# Patient Record
Sex: Female | Born: 1955
Health system: Southern US, Community
[De-identification: ages and names within clinical notes are randomized; demographics above are authoritative.]

---

## 1978-10-04 HISTORY — PX: ABDOMINAL HYSTERECTOMY: SHX81

## 2005-09-08 ENCOUNTER — Ambulatory Visit: Payer: Self-pay

## 2007-10-26 ENCOUNTER — Ambulatory Visit: Payer: Self-pay | Admitting: Family Medicine

## 2009-01-09 ENCOUNTER — Ambulatory Visit: Payer: Self-pay | Admitting: Family Medicine

## 2010-03-17 ENCOUNTER — Ambulatory Visit: Payer: Self-pay | Admitting: Family Medicine

## 2011-07-26 ENCOUNTER — Ambulatory Visit: Payer: Self-pay | Admitting: Family Medicine

## 2011-08-16 LAB — HM COLONOSCOPY

## 2012-07-27 ENCOUNTER — Ambulatory Visit: Payer: Self-pay | Admitting: Family Medicine

## 2013-07-30 ENCOUNTER — Ambulatory Visit: Payer: Self-pay | Admitting: Family Medicine

## 2014-07-23 LAB — BASIC METABOLIC PANEL
BUN: 18 mg/dL (ref 4–21)
Creatinine: 0.8 mg/dL (ref 0.5–1.1)
Glucose: 86 mg/dL
Potassium: 4.5 mmol/L (ref 3.4–5.3)
Sodium: 140 mmol/L (ref 137–147)

## 2014-07-23 LAB — LIPID PANEL
Cholesterol: 200 mg/dL (ref 0–200)
HDL: 49 mg/dL (ref 35–70)
LDL Cholesterol: 131 mg/dL
Triglycerides: 98 mg/dL (ref 40–160)

## 2014-08-22 ENCOUNTER — Ambulatory Visit: Payer: Self-pay | Admitting: Family Medicine

## 2015-07-30 DIAGNOSIS — Z803 Family history of malignant neoplasm of breast: Secondary | ICD-10-CM | POA: Insufficient documentation

## 2015-07-30 DIAGNOSIS — Z8041 Family history of malignant neoplasm of ovary: Secondary | ICD-10-CM | POA: Insufficient documentation

## 2015-07-30 DIAGNOSIS — Z8742 Personal history of other diseases of the female genital tract: Secondary | ICD-10-CM | POA: Insufficient documentation

## 2015-07-31 ENCOUNTER — Encounter: Payer: Self-pay | Admitting: Family Medicine

## 2015-07-31 ENCOUNTER — Ambulatory Visit (INDEPENDENT_AMBULATORY_CARE_PROVIDER_SITE_OTHER): Payer: BLUE CROSS/BLUE SHIELD | Admitting: Family Medicine

## 2015-07-31 VITALS — BP 108/66 | HR 72 | Temp 97.9°F | Resp 14 | Ht 62.5 in | Wt 115.0 lb

## 2015-07-31 DIAGNOSIS — Z8041 Family history of malignant neoplasm of ovary: Secondary | ICD-10-CM

## 2015-07-31 DIAGNOSIS — Z1231 Encounter for screening mammogram for malignant neoplasm of breast: Secondary | ICD-10-CM

## 2015-07-31 DIAGNOSIS — Z23 Encounter for immunization: Secondary | ICD-10-CM

## 2015-07-31 DIAGNOSIS — Z0000 Encounter for general adult medical examination without abnormal findings: Secondary | ICD-10-CM

## 2015-07-31 NOTE — Progress Notes (Signed)
Patient ID: Adrienne Herring, female   DOB: 11/05/1955, 59 y.o.   MRN: [id number]     Visit Date: 07/31/2015  Today's Provider: Mila Merryonald Keaston Pile, MD   Chief Complaint  Patient presents with  . Annual Exam   Subjective:    Annual physical exam Adrienne Herring is a 59 y.o. female who presents today for health maintenance and complete physical. She feels well. She reports exercising-walking. She reports she is sleeping well.  LAST: Colon[place] 08/2011 per [place] in [place].  Mammogram 08/22/14 normal  Pap smear 07/26/14 normal-had hysterectomy before and has ovaries left.  Eye exam-2015 per patient.   Needs flu shot and she is thinking about Zostavax.    Review of Systems  Constitutional: Negative.   HENT: Negative.   Eyes: Negative.   Respiratory: Negative.   Cardiovascular: Negative.   Gastrointestinal: Negative.   Endocrine: Negative.   Genitourinary: Negative.   Musculoskeletal: Positive for arthralgias.  Skin: Negative.   Allergic/Immunologic: Negative.   Neurological: Negative.   Hematological: Negative.   Psychiatric/Behavioral: Negative.     Social History She  reports that she has never smoked. She has never used smokeless tobacco. She reports that she does not drink alcohol or use illicit drugs. Social History   Social History  . Marital Status: Married    Spouse Name: N/A  . Number of Children: N/A  . Years of Education: N/A   Social History Main Topics  . Smoking status: Never Smoker   . Smokeless tobacco: Never Used  . Alcohol Use: No  . Drug Use: No  . Sexual Activity: Not Asked   Other Topics Concern  . None   Social History Narrative    Patient Active Problem List   Diagnosis Date Noted  . H/O female genital system disorder 07/30/2015  . Family history of breast cancer 07/30/2015  . Family history of malignant neoplasm of ovary 07/30/2015    Past Surgical History  Procedure Laterality Date  . Abdominal hysterectomy  1980    due to  endometriosis, still has ovaries    Family History  Family Status  Relation Status Death Age  . Mother Deceased   . Sister Deceased   . Maternal Aunt Alive   . Maternal Grandfather Deceased   . Father Alive   . Daughter Alive   . Daughter Alive    Her family history includes Alcohol abuse in her mother; Breast cancer in her sister; Diabetes in her maternal grandfather; Ovarian cancer in her maternal aunt.    No Known Allergies  Previous Medications   ASCORBIC ACID (VITAMIN C) 100 MG TABLET       CALCIUM CARBONATE (CALCIUM-CARB 600 PO)       MULTIPLE VITAMINS-MINERALS (MULTIVITAMIN ADULT PO)       VITAMIN D, CHOLECALCIFEROL, 400 UNITS TABS    VITAMIN D, 400UNIT (Oral Tablet)  1 Every Day for 0 days  Quantity: 0.00;  Refills: 0   Ordered :21-Jul-2011  Awilda Billhambers, [place] ;  Started 30-Dec-2008 Active    Patient Care Team: Malva Limesonald E Melburn Treiber, MD as PCP - General (Family Medicine)     Objective:   Vitals: BP 108/66 mmHg  Pulse 72  Temp(Src) 97.9 F (36.6 C)  Resp 14  Ht 5' 2.5" (1.588 m)  Wt 115 lb (52.164 kg)  BMI 20.69 kg/m2   Physical Exam  Constitutional: She is oriented to person, place, and time. She appears well-developed and well-nourished.  HENT:  Head: Normocephalic and atraumatic.  Right Ear: External  ear normal.  Left Ear: External ear normal.  Nose: Nose normal.  Mouth/Throat: Oropharynx is clear and moist. No oropharyngeal exudate.  Eyes: Conjunctivae are normal. Pupils are equal, round, and reactive to light.  Cardiovascular: Normal rate, regular rhythm, normal heart sounds and intact distal pulses.   No murmur heard. Pulmonary/Chest: Effort normal and breath sounds normal. No respiratory distress. She has no wheezes. She has no rales. Right breast exhibits no inverted nipple, no mass and no nipple discharge. Left breast exhibits no inverted nipple, no mass and no nipple discharge. Breasts are symmetrical.  Abdominal: Soft. Bowel sounds are normal. She  exhibits no distension and no mass. There is no tenderness. There is no rebound.  Neurological: She is alert and oriented to person, place, and time.  Skin: No rash noted. No erythema.  Psychiatric: She has a normal mood and affect. Her behavior is normal. Judgment and thought content normal.     Depression Screen PHQ 2/9 Scores 07/31/2015  PHQ - 2 Score 0    Lab Results  Component Value Date   CHOL 200 07/23/2014   HDL 49 07/23/2014   LDLCALC 131 07/23/2014   TRIG 98 07/23/2014     Chemistry      Component Value Date/Time   NA 140 07/23/2014   K 4.5 07/23/2014   BUN 18 07/23/2014   CREATININE 0.8 07/23/2014   GLU 86 07/23/2014       Assessment & Plan:  1. Annual physical exam Discussed with patient today getting Zoster vaccine, patient will wait at this time as she does not want to expose anyone to live vaccine.  - Lipid Panel With LDL/HDL Ratio - Basic metabolic panel  2. Visit for screening mammogram Patient advised to call [facility] and get this scheduled. - MM Digital Screening; Future  3. Need for influenza vaccination This administered today. - Flu Vaccine QUAD 36+ mos IM  4. Family history of ovarian cancer - CA-125

## 2015-08-01 LAB — BASIC METABOLIC PANEL
BUN/Creatinine Ratio: 17 (ref 11–26)
BUN: 11 mg/dL (ref 8–27)
CO2: 26 mmol/L (ref 18–29)
Calcium: 9.6 mg/dL (ref 8.7–10.3)
Chloride: 100 mmol/L (ref 97–106)
Creatinine, Ser: 0.64 mg/dL (ref 0.57–1.00)
GFR calc Af Amer: 112 mL/min/{1.73_m2} (ref >59)
GFR calc non Af Amer: 97 mL/min/{1.73_m2} (ref >59)
Glucose: 87 mg/dL (ref 65–99)
Potassium: 4.7 mmol/L (ref 3.5–5.2)
Sodium: 139 mmol/L (ref 136–144)

## 2015-08-01 LAB — LIPID PANEL WITH LDL/HDL RATIO
Cholesterol, Total: 186 mg/dL (ref 100–199)
HDL: 50 mg/dL (ref >39)
LDL Calculated: 110 mg/dL — ABNORMAL HIGH (ref 0–99)
LDl/HDL Ratio: 2.2 ratio units (ref 0.0–3.2)
Triglycerides: 131 mg/dL (ref 0–149)
VLDL Cholesterol Cal: 26 mg/dL (ref 5–40)

## 2015-08-01 LAB — CA 125: CA 125: 19.8 U/mL (ref 0.0–38.1)

## 2015-08-01 NOTE — Progress Notes (Signed)
Patient has been advised. KW 

## 2015-09-03 ENCOUNTER — Ambulatory Visit
Admission: RE | Admit: 2015-09-03 | Discharge: 2015-09-03 | Disposition: A | Discharge status: Home / Self Care | Payer: BLUE CROSS/BLUE SHIELD | Source: Ambulatory Visit | Attending: Family Medicine | Admitting: Family Medicine

## 2015-09-03 DIAGNOSIS — Z1231 Encounter for screening mammogram for malignant neoplasm of breast: Secondary | ICD-10-CM | POA: Diagnosis not present

## 2016-07-27 ENCOUNTER — Other Ambulatory Visit: Payer: Self-pay | Admitting: Family Medicine

## 2016-07-28 ENCOUNTER — Telehealth: Payer: Self-pay | Admitting: Family Medicine

## 2016-07-28 DIAGNOSIS — Z0000 Encounter for general adult medical examination without abnormal findings: Secondary | ICD-10-CM

## 2016-07-28 NOTE — Telephone Encounter (Signed)
Please review. KW 

## 2016-07-28 NOTE — Telephone Encounter (Signed)
Pt is having upcoming CPE and wants to get her labs done beforehand.  She siad she wants to go to the lab early that morning.  [name]

## 2016-08-02 ENCOUNTER — Encounter: Payer: Self-pay | Admitting: Family Medicine

## 2016-08-02 ENCOUNTER — Ambulatory Visit (INDEPENDENT_AMBULATORY_CARE_PROVIDER_SITE_OTHER): Payer: BLUE CROSS/BLUE SHIELD | Admitting: Family Medicine

## 2016-08-02 VITALS — BP 110/68 | Temp 98.0°F | Resp 16 | Ht 63.0 in | Wt 115.0 lb

## 2016-08-02 DIAGNOSIS — Z23 Encounter for immunization: Secondary | ICD-10-CM

## 2016-08-02 DIAGNOSIS — Z0000 Encounter for general adult medical examination without abnormal findings: Secondary | ICD-10-CM

## 2016-08-02 NOTE — Progress Notes (Signed)
Patient: Adrienne Herring, Female    DOB: 09/09/1956, 60 y.o.   MRN: [id number] Visit Date: 08/02/2016  Today's Provider: Mila Merryonald Fisher, MD   Chief Complaint  Patient presents with  . Annual Exam   Subjective:    Annual physical exam Adrienne PollenDonna S Herring is a 60 y.o. female who presents today for health maintenance and complete physical. She feels well. She reports exercising daily. She reports she is sleeping well.     Review of Systems  Constitutional: Negative.   HENT: Negative.   Eyes: Negative.   Respiratory: Negative.   Cardiovascular: Negative.   Gastrointestinal: Negative.   Endocrine: Negative.   Genitourinary: Negative.   Musculoskeletal: Negative.   Skin: Negative.   Allergic/Immunologic: Negative.   Neurological: Negative.   Hematological: Negative.   Psychiatric/Behavioral: Negative.     Social History      She  reports that she has never smoked. She has never used smokeless tobacco. She reports that she does not drink alcohol or use drugs.       Social History   Social History  . Marital status: Married    Spouse name: N/A  . Number of children: N/A  . Years of education: N/A   Social History Main Topics  . Smoking status: Never Smoker  . Smokeless tobacco: Never Used  . Alcohol use No  . Drug use: No  . Sexual activity: Not Asked   Other Topics Concern  . None   Social History Narrative  . None    No past medical history on file.   Patient Active Problem List   Diagnosis Date Noted  . H/O female genital system disorder 07/30/2015  . Family history of breast cancer 07/30/2015  . Family history of malignant neoplasm of ovary 07/30/2015    Past Surgical History:  Procedure Laterality Date  . ABDOMINAL HYSTERECTOMY  1980   due to endometriosis, still has ovaries    Family History        Family Status  Relation Status  . Mother Deceased  . Sister Deceased  . Maternal Aunt Alive  . Maternal Grandfather Deceased  . Father Alive   . Daughter Alive  . Daughter Alive        Her family history includes Alcohol abuse in her mother; Breast cancer (age of onset: 6848) in her sister; Diabetes in her maternal grandfather; Ovarian cancer in her maternal aunt.    No Known Allergies  Current Meds  Medication Sig  . Ascorbic Acid (VITAMIN C) 100 MG tablet   . Calcium Carbonate (CALCIUM-CARB 600 PO)   . Multiple Vitamins-Minerals (MULTIVITAMIN ADULT PO)   . Vitamin D, Cholecalciferol, 400 UNITS TABS VITAMIN D, 400UNIT (Oral Tablet)  1 Every Day for 0 days  Quantity: 0.00;  Refills: 0   Ordered :21-Jul-2011  Awilda Billhambers, [place] ;  Started 30-Dec-2008 Active    Patient Care Team: Malva Limesonald E Fisher, MD as PCP - General (Family Medicine)     Objective:   Vitals: BP 110/68 (BP Location: Left Arm, Patient Position: Sitting, Cuff Size: Normal)   Temp 98 F (36.7 C)   Resp 16   Ht 5\' 3"  (1.6 m)   Wt 115 lb (52.2 kg)   BMI 20.37 kg/m    Physical Exam  Constitutional: She is oriented to person, place, and time. She appears well-developed and well-nourished.  HENT:  Head: Normocephalic and atraumatic.  Right Ear: External ear normal.  Left Ear: External ear normal.  Nose: Nose normal.  Mouth/Throat: Oropharynx is clear and moist.  Eyes: Conjunctivae and EOM are normal. Pupils are equal, round, and reactive to light.  Neck: Normal range of motion. Neck supple.  Cardiovascular: Normal rate, regular rhythm, normal heart sounds and intact distal pulses.   Pulmonary/Chest: Effort normal and breath sounds normal. Right breast exhibits no mass, no nipple discharge and no tenderness. Left breast exhibits no mass, no nipple discharge and no tenderness.  Abdominal: Soft. Bowel sounds are normal. There is no tenderness.  Musculoskeletal: Normal range of motion.  Neurological: She is alert and oriented to person, place, and time. She has normal reflexes.  Skin: Skin is warm and dry.  Psychiatric: She has a normal mood and affect.  Her behavior is normal. Judgment and thought content normal.     Depression Screen PHQ 2/9 Scores 07/31/2015  PHQ - 2 Score 0      Assessment & Plan:     Routine Health Maintenance and Physical Exam  Exercise Activities and Dietary recommendations Goals    None      Immunization History  Administered Date(s) Administered  . Influenza,inj,Quad PF,36+ Mos 07/31/2015  . Tdap 10/24/2007    Health Maintenance  Topic Date Due  . HIV Screening  10/09/1969  . COLONOSCOPY  10/09/2004  . ZOSTAVAX  10/09/2014  . INFLUENZA VACCINE  05/04/2016  . MAMMOGRAM  09/02/2017  . TETANUS/TDAP  10/23/2017  . Hepatitis C Screening  Completed      Discussed health benefits of physical activity, and encouraged her to engage in regular exercise appropriate for her age and condition.    --------------------------------------------------------------------  1. Annual physical exam Patient declined shingles vaccine today  2. Need for influenza vaccination  - Flu Vaccine QUAD 36+ mos PF IM (Fluarix & Fluzone Quad PF)  The entirety of the information documented in the History of Present Illness, Review of Systems and Physical Exam were personally obtained by me. Portions of this information were initially documented by Anson Oregonachelle Presley, CMA and reviewed by me for thoroughness and accuracy.     Mila Merryonald Fisher, MD  [facility]

## 2016-08-03 ENCOUNTER — Other Ambulatory Visit: Payer: Self-pay | Admitting: Family Medicine

## 2016-08-03 DIAGNOSIS — Z1231 Encounter for screening mammogram for malignant neoplasm of breast: Secondary | ICD-10-CM

## 2016-08-03 LAB — COMPREHENSIVE METABOLIC PANEL
ALT: 11 IU/L (ref 0–32)
AST: 18 IU/L (ref 0–40)
Albumin/Globulin Ratio: 1.8 (ref 1.2–2.2)
Albumin: 4.8 g/dL (ref 3.6–4.8)
Alkaline Phosphatase: 54 IU/L (ref 39–117)
BUN/Creatinine Ratio: 14 (ref 12–28)
BUN: 10 mg/dL (ref 8–27)
Bilirubin Total: 0.6 mg/dL (ref 0.0–1.2)
CO2: 25 mmol/L (ref 18–29)
Calcium: 9.4 mg/dL (ref 8.7–10.3)
Chloride: 102 mmol/L (ref 96–106)
Creatinine, Ser: 0.7 mg/dL (ref 0.57–1.00)
GFR calc Af Amer: 108 mL/min/{1.73_m2} (ref >59)
GFR calc non Af Amer: 94 mL/min/{1.73_m2} (ref >59)
Globulin, Total: 2.7 g/dL (ref 1.5–4.5)
Glucose: 96 mg/dL (ref 65–99)
Potassium: 3.9 mmol/L (ref 3.5–5.2)
Sodium: 142 mmol/L (ref 134–144)
Total Protein: 7.5 g/dL (ref 6.0–8.5)

## 2016-08-03 LAB — LIPID PANEL
Chol/HDL Ratio: 3.4 ratio units (ref 0.0–4.4)
Cholesterol, Total: 195 mg/dL (ref 100–199)
HDL: 57 mg/dL (ref >39)
LDL Calculated: 122 mg/dL — ABNORMAL HIGH (ref 0–99)
Triglycerides: 82 mg/dL (ref 0–149)
VLDL Cholesterol Cal: 16 mg/dL (ref 5–40)

## 2016-08-04 ENCOUNTER — Encounter: Payer: Self-pay | Admitting: *Deleted

## 2016-09-08 ENCOUNTER — Ambulatory Visit
Admission: RE | Admit: 2016-09-08 | Discharge: 2016-09-08 | Disposition: A | Discharge status: Home / Self Care | Payer: BLUE CROSS/BLUE SHIELD | Source: Ambulatory Visit | Attending: Family Medicine | Admitting: Family Medicine

## 2016-09-08 DIAGNOSIS — Z1231 Encounter for screening mammogram for malignant neoplasm of breast: Secondary | ICD-10-CM | POA: Diagnosis not present

## 2017-07-29 ENCOUNTER — Telehealth: Payer: Self-pay | Admitting: Family Medicine

## 2017-07-29 DIAGNOSIS — Z13228 Encounter for screening for other metabolic disorders: Secondary | ICD-10-CM

## 2017-07-29 NOTE — Telephone Encounter (Signed)
Pt wants to have her labs done before her appt on 08/03/17.  Please call when lab slip is ready.

## 2017-07-29 NOTE — Telephone Encounter (Signed)
Please advised  

## 2017-08-03 ENCOUNTER — Ambulatory Visit (INDEPENDENT_AMBULATORY_CARE_PROVIDER_SITE_OTHER): Payer: BLUE CROSS/BLUE SHIELD | Admitting: Family Medicine

## 2017-08-03 ENCOUNTER — Encounter: Payer: Self-pay | Admitting: Family Medicine

## 2017-08-03 VITALS — BP 110/72 | HR 76 | Temp 98.6°F | Resp 16 | Ht 63.0 in | Wt 116.0 lb

## 2017-08-03 DIAGNOSIS — Z0000 Encounter for general adult medical examination without abnormal findings: Secondary | ICD-10-CM

## 2017-08-03 DIAGNOSIS — Z23 Encounter for immunization: Secondary | ICD-10-CM | POA: Diagnosis not present

## 2017-08-03 LAB — BASIC METABOLIC PANEL WITH GFR
BUN: 13 mg/dL (ref 7–25)
CO2: 29 mmol/L (ref 20–32)
Calcium: 9.5 mg/dL (ref 8.6–10.4)
Chloride: 102 mmol/L (ref 98–110)
Creat: 0.66 mg/dL (ref 0.50–0.99)
GFR, Est African American: 110 mL/min/{1.73_m2} (ref >=60)
GFR, Est Non African American: 95 mL/min/{1.73_m2} (ref >=60)
Glucose, Bld: 88 mg/dL (ref 65–99)
Potassium: 4.2 mmol/L (ref 3.5–5.3)
Sodium: 139 mmol/L (ref 135–146)

## 2017-08-03 LAB — LIPID PANEL
Cholesterol: 206 mg/dL — ABNORMAL HIGH (ref <200)
HDL: 56 mg/dL (ref >50)
LDL Cholesterol (Calc): 131 mg/dL (calc) — ABNORMAL HIGH
Non-HDL Cholesterol (Calc): 150 mg/dL (calc) — ABNORMAL HIGH (ref <130)
Total CHOL/HDL Ratio: 3.7 (calc) (ref <5.0)
Triglycerides: 90 mg/dL (ref <150)

## 2017-08-03 NOTE — Patient Instructions (Signed)
Preventive Care 40-64 Years, Female Preventive care refers to lifestyle choices and visits with your health care provider that can promote health and wellness. What does preventive care include?  A yearly physical exam. This is also called an annual well check.  Dental exams once or twice a year.  Routine eye exams. Ask your health care provider how often you should have your eyes checked.  Personal lifestyle choices, including: ? Daily care of your teeth and gums. ? Regular physical activity. ? Eating a healthy diet. ? Avoiding tobacco and drug use. ? Limiting alcohol use. ? Practicing safe sex. ? Taking low-dose aspirin daily starting at age 62. ? Taking vitamin and mineral supplements as recommended by your health care provider. What happens during an annual well check? The services and screenings done by your health care provider during your annual well check will depend on your age, overall health, lifestyle risk factors, and family history of disease. Counseling Your health care provider may ask you questions about your:  Alcohol use.  Tobacco use.  Drug use.  Emotional well-being.  Home and relationship well-being.  Sexual activity.  Eating habits.  Work and work Statistician.  Method of birth control.  Menstrual cycle.  Pregnancy history.  Screening You may have the following tests or measurements:  Height, weight, and BMI.  Blood pressure.  Lipid and cholesterol levels. These may be checked every 5 years, or more frequently if you are over 62 years old.  Skin check.  Lung cancer screening. You may have this screening every year starting at age 62 if you have a 30-pack-year history of smoking and currently smoke or have quit within the past 15 years.  Fecal occult blood test (FOBT) of the stool. You may have this test every year starting at age 62.  Flexible sigmoidoscopy or colonoscopy. You may have a sigmoidoscopy every 5 years or a colonoscopy  every 10 years starting at age 30.  Hepatitis C blood test.  Hepatitis B blood test.  Sexually transmitted disease (STD) testing.  Diabetes screening. This is done by checking your blood sugar (glucose) after you have not eaten for a while (fasting). You may have this done every 1-3 years.  Mammogram. This may be done every 1-2 years. Talk to your health care provider about when you should start having regular mammograms. This may depend on whether you have a family history of breast cancer.  BRCA-related cancer screening. This may be done if you have a family history of breast, ovarian, tubal, or peritoneal cancers.  Pelvic exam and Pap test. This may be done every 3 years starting at age 62. Starting at age 62, this may be done every 5 years if you have a Pap test in combination with an HPV test.  Bone density scan. This is done to screen for osteoporosis. You may have this scan if you are at high risk for osteoporosis.  Discuss your test results, treatment options, and if necessary, the need for more tests with your health care provider. Vaccines Your health care provider may recommend certain vaccines, such as:  Influenza vaccine. This is recommended every year.  Tetanus, diphtheria, and acellular pertussis (Tdap, Td) vaccine. You may need a Td booster every 10 years.  Varicella vaccine. You may need this if you have not been vaccinated.  Zoster vaccine. You may need this after age 62.  Measles, mumps, and rubella (MMR) vaccine. You may need at least one dose of MMR if you were born in  1957 or later. You may also need a second dose.  Pneumococcal 13-valent conjugate (PCV13) vaccine. You may need this if you have certain conditions and were not previously vaccinated.  Pneumococcal polysaccharide (PPSV23) vaccine. You may need one or two doses if you smoke cigarettes or if you have certain conditions.  Meningococcal vaccine. You may need this if you have certain  conditions.  Hepatitis A vaccine. You may need this if you have certain conditions or if you travel or work in places where you may be exposed to hepatitis A.  Hepatitis B vaccine. You may need this if you have certain conditions or if you travel or work in places where you may be exposed to hepatitis B.  Haemophilus influenzae type b (Hib) vaccine. You may need this if you have certain conditions.  Talk to your health care provider about which screenings and vaccines you need and how often you need them. This information is not intended to replace advice given to you by your health care provider. Make sure you discuss any questions you have with your health care provider. Document Released: 10/17/2015 Document Revised: 06/09/2016 Document Reviewed: 07/22/2015 Elsevier Interactive Patient Education  2017 [employer].

## 2017-08-03 NOTE — Progress Notes (Signed)
Patient: Adrienne Herring, Female    DOB: May 31, 1956, 61 y.o.   MRN: [id number] Visit Date: 08/03/2017  Today's Provider: [name], MD   Chief Complaint  Patient presents with  . Annual Exam   Subjective:    Annual physical exam Adrienne Herring is a 61 y.o. female who presents today for health maintenance and complete physical. She feels well. She reports exercising not regularly. She reports she is sleeping well.  Mammogram- 09/08/2016. Normal.      Review of Systems  Constitutional: Negative.   HENT: Negative.   Eyes: Negative.   Respiratory: Negative.   Cardiovascular: Negative.   Gastrointestinal: Negative.   Endocrine: Negative.   Genitourinary: Negative.   Musculoskeletal: Negative.   Skin: Negative.   Allergic/Immunologic: Negative.   Neurological: Negative.   Hematological: Negative.   Psychiatric/Behavioral: Negative.     Social History      She  reports that she has never smoked. She has never used smokeless tobacco. She reports that she does not drink alcohol or use drugs.       Social History   Social History  . Marital status: Married    Spouse name: N/A  . Number of children: N/A  . Years of education: N/A   Social History Main Topics  . Smoking status: Never Smoker  . Smokeless tobacco: Never Used  . Alcohol use No  . Drug use: No  . Sexual activity: Not Asked   Other Topics Concern  . None   Social History Narrative  . None    No past medical history on file.   Patient Active Problem List   Diagnosis Date Noted  . H/O female genital system disorder 07/30/2015  . Family history of breast cancer 07/30/2015  . Family history of malignant neoplasm of ovary 07/30/2015    Past Surgical History:  Procedure Laterality Date  . ABDOMINAL HYSTERECTOMY  1980   due to endometriosis, still has ovaries    Family History        Family Status  Relation Status  . Mother Deceased  . Sister Deceased  . Mat [employer]  . MGF  Deceased  . Father Alive  . Daughter Alive  . Daughter Alive        Her family history includes Alcohol abuse in her mother; Breast cancer (age of onset: 38) in her sister; Diabetes in her maternal grandfather; Ovarian cancer in her maternal aunt.     No Known Allergies   Current Outpatient Prescriptions:  .  Ascorbic Acid (VITAMIN C) 100 MG tablet, , Disp: , Rfl:  .  Calcium Carbonate (CALCIUM-CARB 600 PO), , Disp: , Rfl:  .  Multiple Vitamins-Minerals (MULTIVITAMIN ADULT PO), , Disp: , Rfl:  .  Vitamin D, Cholecalciferol, 400 UNITS TABS, VITAMIN D, 400UNIT (Oral Tablet)  1 Every Day for 0 days  Quantity: 0.00;  Refills: 0   Ordered :21-Jul-2011  Awilda Bill ;  Started 30-Dec-2008 Active, Disp: , Rfl:    Patient Care Team: Malva Limes, MD as PCP - General (Family Medicine)      Objective:   Vitals: BP 110/72 (BP Location: Left Arm, Patient Position: Sitting, Cuff Size: Normal)   Pulse 76   Temp 98.6 F (37 C)   Resp 16   Ht 5\' 3"  (1.6 m)   Wt 116 lb (52.6 kg)   SpO2 95%   BMI 20.55 kg/m    Vitals:   08/03/17 1446  BP:  110/72  Pulse: 76  Resp: 16  Temp: 98.6 F (37 C)  SpO2: 95%  Weight: 116 lb (52.6 kg)  Height: 5\' 3"  (1.6 m)     Physical Exam  Constitutional: She is oriented to person, place, and time. She appears well-developed and well-nourished.  HENT:  Head: Normocephalic and atraumatic.  Right Ear: External ear normal.  Left Ear: External ear normal.  Nose: Nose normal.  Mouth/Throat: Oropharynx is clear and moist.  Eyes: Pupils are equal, round, and reactive to light. Conjunctivae and EOM are normal.  Neck: Normal range of motion. Neck supple.  Cardiovascular: Normal rate, regular rhythm and normal heart sounds.   Pulmonary/Chest: Effort normal and breath sounds normal. Right breast exhibits no inverted nipple, no mass, no nipple discharge, no skin change and no tenderness. Left breast exhibits no inverted nipple, no mass, no nipple  discharge, no skin change and no tenderness. Breasts are symmetrical.  Abdominal: Soft. Bowel sounds are normal.  Musculoskeletal: Normal range of motion.  Neurological: She is alert and oriented to person, place, and time.  Skin: Skin is warm and dry.  Psychiatric: She has a normal mood and affect. Her behavior is normal. Judgment and thought content normal.  Nursing note and vitals reviewed.    Depression Screen PHQ 2/9 Scores 08/03/2017 07/31/2015  PHQ - 2 Score 0 0  PHQ- 9 Score 0 -      Assessment & Plan:     Routine Health Maintenance and Physical Exam  Exercise Activities and Dietary recommendations Goals    None      Immunization History  Administered Date(s) Administered  . Influenza,inj,Quad PF,6+ Mos 07/31/2015, 08/02/2016  . Tdap 10/24/2007    Health Maintenance  Topic Date Due  . HIV Screening  10/09/1969  . COLONOSCOPY  10/09/2004  . INFLUENZA VACCINE  05/04/2017  . TETANUS/TDAP  10/23/2017  . MAMMOGRAM  09/08/2018  . Hepatitis C Screening  Completed     Discussed health benefits of physical activity, and encouraged her to engage in regular exercise appropriate for her age and condition.    -------------------------------------------------------------------- 1. Annual physical exam Doing well. Mammogram in December. Normal colonoscopy 2012 by Dr. Markham JordanElliot per patient report. Send for records.   2. Need for influenza vaccination  - Flu Vaccine QUAD 36+ mos IM     Mila Merryonald Fisher, MD  [facility]

## 2017-08-09 ENCOUNTER — Other Ambulatory Visit: Payer: Self-pay | Admitting: Family Medicine

## 2017-08-09 DIAGNOSIS — Z1231 Encounter for screening mammogram for malignant neoplasm of breast: Secondary | ICD-10-CM

## 2017-09-09 ENCOUNTER — Ambulatory Visit
Admission: RE | Admit: 2017-09-09 | Discharge: 2017-09-09 | Disposition: A | Discharge status: Home / Self Care | Payer: BLUE CROSS/BLUE SHIELD | Source: Ambulatory Visit | Attending: Family Medicine | Admitting: Family Medicine

## 2017-09-09 DIAGNOSIS — Z1231 Encounter for screening mammogram for malignant neoplasm of breast: Secondary | ICD-10-CM | POA: Diagnosis present

## 2018-06-14 ENCOUNTER — Telehealth: Payer: Self-pay | Admitting: Family Medicine

## 2018-06-14 NOTE — Telephone Encounter (Signed)
Please advise 

## 2018-06-14 NOTE — Telephone Encounter (Signed)
Pt is scheduled for CPE on 08/09/18.  States she would like to have her labs done prior to appt.   °

## 2018-06-14 NOTE — Telephone Encounter (Signed)
Left message (Per DPR) advising pt.   Thanks,   -Vernona Rieger

## 2018-06-14 NOTE — Telephone Encounter (Signed)
She can call a week before her appointment for lab order.

## 2018-07-26 ENCOUNTER — Telehealth: Payer: Self-pay | Admitting: Family Medicine

## 2018-07-26 DIAGNOSIS — Z13228 Encounter for screening for other metabolic disorders: Secondary | ICD-10-CM

## 2018-07-26 NOTE — Telephone Encounter (Signed)
Please advise 

## 2018-07-26 NOTE — Telephone Encounter (Signed)
Pt is coming in on 11/6 to have a physical and would like to have her labs done before her physical so she can discuss them with Dr. Sherrie Mustache the day of her physical CB#  [phone]  Thanks  Barth Kirks

## 2018-07-26 NOTE — Telephone Encounter (Signed)
Future order is in EMR.

## 2018-07-27 NOTE — Telephone Encounter (Signed)
Pt advised.   Thanks,   -Gaylen Venning  

## 2018-08-01 ENCOUNTER — Other Ambulatory Visit: Payer: Self-pay | Admitting: Family Medicine

## 2018-08-02 LAB — BASIC METABOLIC PANEL
BUN/Creatinine Ratio: 11 — ABNORMAL LOW (ref 12–28)
BUN: 8 mg/dL (ref 8–27)
CO2: 24 mmol/L (ref 20–29)
Calcium: 9.4 mg/dL (ref 8.7–10.3)
Chloride: 103 mmol/L (ref 96–106)
Creatinine, Ser: 0.7 mg/dL (ref 0.57–1.00)
GFR calc Af Amer: 107 mL/min/{1.73_m2} (ref >59)
GFR calc non Af Amer: 93 mL/min/{1.73_m2} (ref >59)
Glucose: 86 mg/dL (ref 65–99)
Potassium: 4.6 mmol/L (ref 3.5–5.2)
Sodium: 141 mmol/L (ref 134–144)

## 2018-08-02 LAB — LIPID PANEL
Chol/HDL Ratio: 3.8 ratio (ref 0.0–4.4)
Cholesterol, Total: 175 mg/dL (ref 100–199)
HDL: 46 mg/dL (ref >39)
LDL Calculated: 107 mg/dL — ABNORMAL HIGH (ref 0–99)
Triglycerides: 108 mg/dL (ref 0–149)
VLDL Cholesterol Cal: 22 mg/dL (ref 5–40)

## 2018-08-09 ENCOUNTER — Ambulatory Visit (INDEPENDENT_AMBULATORY_CARE_PROVIDER_SITE_OTHER): Payer: BLUE CROSS/BLUE SHIELD | Admitting: Family Medicine

## 2018-08-09 ENCOUNTER — Encounter: Payer: Self-pay | Admitting: Family Medicine

## 2018-08-09 VITALS — BP 108/74 | HR 76 | Temp 98.4°F | Resp 16 | Ht 63.0 in | Wt 116.0 lb

## 2018-08-09 DIAGNOSIS — Z0000 Encounter for general adult medical examination without abnormal findings: Secondary | ICD-10-CM

## 2018-08-09 DIAGNOSIS — Z23 Encounter for immunization: Secondary | ICD-10-CM

## 2018-08-09 NOTE — Patient Instructions (Addendum)
The CDC recommends two doses of Shingrix (the shingles vaccine) separated by 2 to 6 months for adults age 63 years and older. I recommend checking with your insurance plan regarding coverage for this vaccine.   . Please call the [facility] ([phone]) to schedule a routine screening mammogram.   

## 2018-08-09 NOTE — Progress Notes (Signed)
Patient: Adrienne Herring, Female    DOB: 1956-02-19, 62 y.o.   MRN: [id number] Visit Date: 08/09/2018  Today's Provider: [name], MD   Chief Complaint  Patient presents with  . Annual Exam   Subjective:    Annual physical exam Adrienne Herring is a 62 y.o. female who presents today for health maintenance and complete physical. She feels fairly well. She reports exercising daily. She reports she is sleeping well.  -----------------------------------------------------------------  Cholesterol Check: Patient was last seen for this problem 1 year ago. Management during that visit includes advising patient to avoid saturated fats in her diet. Patient reports good compliance with diet control.   Review of Systems  Constitutional: Negative for chills, fatigue and fever.  HENT: Negative for congestion, ear pain, rhinorrhea, sneezing and sore throat.   Eyes: Negative.  Negative for pain and redness.  Respiratory: Negative for cough, shortness of breath and wheezing.   Cardiovascular: Negative for chest pain and leg swelling.  Gastrointestinal: Negative for abdominal pain, blood in stool, constipation, diarrhea and nausea.  Endocrine: Negative for polydipsia and polyphagia.  Genitourinary: Negative.  Negative for dysuria, flank pain, hematuria, pelvic pain, vaginal bleeding and vaginal discharge.  Musculoskeletal: Negative for arthralgias, back pain, gait problem and joint swelling.  Skin: Negative for rash.  Neurological: Negative.  Negative for dizziness, tremors, seizures, weakness, light-headedness, numbness and headaches.  Hematological: Negative for adenopathy.  Psychiatric/Behavioral: Negative.  Negative for behavioral problems, confusion and dysphoric mood. The patient is not nervous/anxious and is not hyperactive.     Social History      She  reports that she has never smoked. She has never used smokeless tobacco. She reports that she does not drink alcohol or use  drugs.       Social History   Socioeconomic History  . Marital status: Married    Spouse name: Not on file  . Number of children: 2  . Years of education: Not on file  . Highest education level: Not on file  Occupational History  . Not on file  Social Needs  . Financial resource strain: Not on file  . Food insecurity:    Worry: Not on file    Inability: Not on file  . Transportation needs:    Medical: Not on file    Non-medical: Not on file  Tobacco Use  . Smoking status: Never Smoker  . Smokeless tobacco: Never Used  Substance and Sexual Activity  . Alcohol use: No  . Drug use: No  . Sexual activity: Not on file  Lifestyle  . Physical activity:    Days per week: Not on file    Minutes per session: Not on file  . Stress: Not on file  Relationships  . Social connections:    Talks on phone: Not on file    Gets together: Not on file    Attends religious service: Not on file    Active member of club or organization: Not on file    Attends meetings of clubs or organizations: Not on file    Relationship status: Not on file  Other Topics Concern  . Not on file  Social History Narrative  . Not on file    No past medical history on file.   Patient Active Problem List   Diagnosis Date Noted  . H/O female genital system disorder 07/30/2015  . Family history of breast cancer 07/30/2015  . Family history of malignant neoplasm of ovary  07/30/2015    Past Surgical History:  Procedure Laterality Date  . ABDOMINAL HYSTERECTOMY  1980   due to endometriosis, still has ovaries    Family History        Family Status  Relation Name Status  . Mother  Deceased  . Sister  Deceased  . [employer]  . MGF  Deceased  . Father  Alive  . Daughter  Alive  . Daughter  Alive        Her family history includes Alcohol abuse in her mother; Breast cancer (age of onset: 58) in her sister; Diabetes in her maternal grandfather; Ovarian cancer in her maternal aunt.      No  Known Allergies   Current Outpatient Medications:  .  Ascorbic Acid (VITAMIN C) 100 MG tablet, , Disp: , Rfl:  .  Calcium Carb-Cholecalciferol (CALCIUM 600 + D PO), Take 1 tablet by mouth 3 (three) times daily., Disp: , Rfl:  .  Multiple Vitamins-Minerals (MULTIVITAMIN ADULT PO), , Disp: , Rfl:    Patient Care Team: Malva Limes, MD as PCP - General (Family Medicine)      Objective:   Vitals: BP 108/74 (BP Location: Left Arm, Patient Position: Sitting, Cuff Size: Normal)   Pulse 76   Temp 98.4 F (36.9 C) (Oral)   Resp 16   Ht 5\' 3"  (1.6 m)   Wt 116 lb (52.6 kg)   SpO2 98% Comment: room air  BMI 20.55 kg/m    Vitals:   08/09/18 1412  BP: 108/74  Pulse: 76  Resp: 16  Temp: 98.4 F (36.9 C)  TempSrc: Oral  SpO2: 98%  Weight: 116 lb (52.6 kg)  Height: 5\' 3"  (1.6 m)     Physical Exam  General Appearance:    Alert, cooperative, no distress, appears stated age  Head:    Normocephalic, without obvious abnormality, atraumatic  Eyes:    PERRL, conjunctiva/corneas clear, EOM's intact, fundi    benign, both eyes  Ears:    Normal TM's and external ear canals, both ears  Nose:   Nares normal, septum midline, mucosa normal, no drainage    or sinus tenderness  Throat:   Lips, mucosa, and tongue normal; teeth and gums normal  Neck:   Supple, symmetrical, trachea midline, no adenopathy;    thyroid:  no enlargement/tenderness/nodules; no carotid   bruit or JVD  Back:     Symmetric, no curvature, ROM normal, no CVA tenderness  Lungs:     Clear to auscultation bilaterally, respirations unlabored  Chest Wall:    No tenderness or deformity   Heart:    Regular rate and rhythm, S1 and S2 normal, no murmur, rub   or gallop  Breast Exam:    normal appearance, no masses or tenderness  Abdomen:     Soft, non-tender, bowel sounds active all four quadrants,    no masses, no organomegaly  Pelvic:    not indicated; status post hysterectomy, negative ROS  Extremities:   Extremities  normal, atraumatic, no cyanosis or edema  Pulses:   2+ and symmetric all extremities  Skin:   Skin color, texture, turgor normal, no rashes or lesions  Lymph nodes:   Cervical, supraclavicular, and axillary nodes normal  Neurologic:   CNII-XII intact, normal strength, sensation and reflexes    throughout    Depression Screen PHQ 2/9 Scores 08/09/2018 08/03/2017 07/31/2015  PHQ - 2 Score 0 0 0  PHQ- 9 Score 0 0 -  Assessment & Plan:     Routine Health Maintenance and Physical Exam  Exercise Activities and Dietary recommendations Goals   None     Immunization History  Administered Date(s) Administered  . Influenza,inj,Quad PF,6+ Mos 07/31/2015, 08/02/2016, 08/03/2017  . Tdap 10/24/2007    Health Maintenance  Topic Date Due  . HIV Screening  10/09/1969  . COLONOSCOPY  08/15/2021  . TETANUS/TDAP  10/23/2017  . INFLUENZA VACCINE  05/04/2018  . MAMMOGRAM  09/10/2019  . Hepatitis C Screening  Completed     Discussed health benefits of physical activity, and encouraged her to engage in regular exercise appropriate for her age and condition.    --------------------------------------------------------------------  1. Annual physical exam Recommended shingrix which she declined today. Normal exam. Reviewed labs.   2. Need for influenza vaccination  - Flu Vaccine QUAD 6+ mos PF IM (Fluarix Quad PF)  3. Need for tetanus booster  - Td : Tetanus/diphtheria >7yo Preservative  free   [name], MD  [facility]

## 2018-08-10 ENCOUNTER — Other Ambulatory Visit: Payer: Self-pay | Admitting: Family Medicine

## 2018-08-10 DIAGNOSIS — Z1231 Encounter for screening mammogram for malignant neoplasm of breast: Secondary | ICD-10-CM

## 2018-09-15 ENCOUNTER — Ambulatory Visit
Admission: RE | Admit: 2018-09-15 | Discharge: 2018-09-15 | Disposition: A | Discharge status: Home / Self Care | Payer: BLUE CROSS/BLUE SHIELD | Source: Ambulatory Visit | Attending: Family Medicine | Admitting: Family Medicine

## 2018-09-15 DIAGNOSIS — Z1231 Encounter for screening mammogram for malignant neoplasm of breast: Secondary | ICD-10-CM | POA: Insufficient documentation

## 2019-08-08 ENCOUNTER — Telehealth: Payer: Self-pay | Admitting: Family Medicine

## 2019-08-08 DIAGNOSIS — Z0000 Encounter for general adult medical examination without abnormal findings: Secondary | ICD-10-CM

## 2019-08-08 NOTE — Telephone Encounter (Signed)
I can put this order in if you let me know what you want. Possibly Met C, Lipid, TSH CBC is that ok

## 2019-08-08 NOTE — Telephone Encounter (Signed)
Pt has a CPE appt on 11/10 at 2:00.  Pt wants to come in the morning before the appt to have her labs done.  Please call pt back to let her know if this can be done at [phone].  Thanks, [employer]

## 2019-08-11 NOTE — Telephone Encounter (Signed)
OK to print order, but not sure if she will get message before her appointment

## 2019-08-13 NOTE — Telephone Encounter (Signed)
Patient advised. Order placed up front at [address].

## 2019-08-14 ENCOUNTER — Ambulatory Visit (INDEPENDENT_AMBULATORY_CARE_PROVIDER_SITE_OTHER): Payer: PRIVATE HEALTH INSURANCE | Admitting: Family Medicine

## 2019-08-14 ENCOUNTER — Encounter: Payer: Self-pay | Admitting: Family Medicine

## 2019-08-14 ENCOUNTER — Other Ambulatory Visit: Payer: Self-pay

## 2019-08-14 VITALS — BP 112/66 | HR 74 | Temp 96.9°F | Resp 16 | Ht 63.0 in | Wt 118.0 lb

## 2019-08-14 DIAGNOSIS — Z0000 Encounter for general adult medical examination without abnormal findings: Secondary | ICD-10-CM

## 2019-08-14 DIAGNOSIS — Z23 Encounter for immunization: Secondary | ICD-10-CM

## 2019-08-14 NOTE — Patient Instructions (Addendum)
.   Please review the attached list of medications and notify my office if there are any errors.   . Please bring all of your medications to every appointment so we can make sure that our medication list is the same as yours.   . Please call the [facility] [phone]) to schedule a routine screening mammogram.   The CDC recommends two doses of Shingrix (the shingles vaccine) separated by 2 to 6 months for adults age 64 years and older. I recommend checking with your insurance plan regarding coverage for this vaccine.   . You are due for a Tdap (tetanus-diptheria-pertussis vaccine) which protects you from tetanus and whooping cough. Please check with your insurance plan or pharmacy regarding coverage for this vaccine.

## 2019-08-14 NOTE — Progress Notes (Signed)
Patient: Adrienne Herring, Female    DOB: Sep 13, 1956, 63 y.o.   MRN: [id number] Visit Date: 08/14/2019  Today's Provider: Lelon Huh, MD   Chief Complaint  Patient presents with  . Annual Exam   Subjective:     Annual physical exam Adrienne Herring is a 63 y.o. female who presents today for health maintenance and complete physical. She feels fairly well. [place] She reports she is sleeping fairly well.  -----------------------------------------------------------------   Review of Systems  Constitutional: Negative for chills, fatigue and fever.  HENT: Negative for congestion, ear pain, rhinorrhea, sneezing and sore throat.   Eyes: Negative.  Negative for pain and redness.  Respiratory: Negative for cough, shortness of breath and wheezing.   Cardiovascular: Negative for chest pain and leg swelling.  Gastrointestinal: Negative for abdominal pain, blood in stool, constipation, diarrhea and nausea.  Endocrine: Negative for polydipsia and polyphagia.  Genitourinary: Negative.  Negative for dysuria, flank pain, hematuria, pelvic pain, vaginal bleeding and vaginal discharge.  Musculoskeletal: Negative for arthralgias, back pain, gait problem and joint swelling.  Skin: Negative for rash.  Neurological: Negative.  Negative for dizziness, tremors, seizures, weakness, light-headedness, numbness and headaches.  Hematological: Negative for adenopathy.  Psychiatric/Behavioral: Negative.  Negative for behavioral problems, confusion and dysphoric mood. The patient is not nervous/anxious and is not hyperactive.     Social History      She  reports that she has never smoked. She has never used smokeless tobacco. She reports that she does not drink alcohol or use drugs.       Social History   Socioeconomic History  . Marital status: Married    Spouse name: Not on file  . Number of children: 2  . Years of education: Not on file  . Highest education  level: Not on file  Occupational History  . Not on file  Social Needs  . Financial resource strain: Not on file  . Food insecurity    Worry: Not on file    Inability: Not on file  . Transportation needs    Medical: Not on file    Non-medical: Not on file  Tobacco Use  . Smoking status: Never Smoker  . Smokeless tobacco: Never Used  Substance and Sexual Activity  . Alcohol use: No  . Drug use: No  . Sexual activity: Not on file  Lifestyle  . Physical activity    Days per week: Not on file    Minutes per session: Not on file  . Stress: Not on file  Relationships  . Social Herbalist on phone: Not on file    Gets together: Not on file    Attends religious service: Not on file    Active member of club or organization: Not on file    Attends meetings of clubs or organizations: Not on file    Relationship status: Not on file  Other Topics Concern  . Not on file  Social History Narrative  . Not on file    No past medical history on file.   Patient Active Problem List   Diagnosis Date Noted  . H/O female genital system disorder 07/30/2015  . Family history of breast cancer 07/30/2015  . Family history of malignant neoplasm of ovary 07/30/2015    Past Surgical History:  Procedure Laterality Date  . ABDOMINAL HYSTERECTOMY  1980   due to endometriosis, still has ovaries  Family History        Family Status  Relation Name Status  . Mother  Deceased  . Sister  Deceased  . [employer]  Alive  . MGF  Deceased  . Father  Alive  . Daughter  Alive  . Daughter  Alive        Her family history includes Alcohol abuse in her mother; Breast cancer (age of onset: 7248) in her sister; Diabetes in her maternal grandfather; Ovarian cancer in her maternal aunt.      No Known Allergies   Current Outpatient Medications:  .  Ascorbic Acid (VITAMIN C) 100 MG tablet, , Disp: , Rfl:  .  Calcium Carb-Cholecalciferol (CALCIUM 600 + D PO), Take 1 tablet by mouth 3 (three)  times daily., Disp: , Rfl:  .  Cholecalciferol 25 MCG (1000 UT) tablet, Take 1,000 Units by mouth daily., Disp: , Rfl:  .  Multiple Vitamins-Minerals (MULTIVITAMIN ADULT PO), , Disp: , Rfl:    Patient Care Team: Malva LimesFisher, Donald E, MD as PCP - General (Family Medicine)    Objective:    Vitals: BP 112/66 (BP Location: Left Arm, Patient Position: Sitting, Cuff Size: Normal)   Pulse 74   Temp (!) 96.9 F (36.1 C) (Temporal)   Resp 16   Ht 5\' 3"  (1.6 m)   Wt 118 lb (53.5 kg)   SpO2 98% Comment: room air  BMI 20.90 kg/m    Vitals:   08/14/19 1400  BP: 112/66  Pulse: 74  Resp: 16  Temp: (!) 96.9 F (36.1 C)  TempSrc: Temporal  SpO2: 98%  Weight: 118 lb (53.5 kg)  Height: 5\' 3"  (1.6 m)     Physical Exam   General Appearance:    Well developed, well nourished female. Alert, cooperative, in no acute distress, appears stated age   Head:    Normocephalic, without obvious abnormality, atraumatic  Eyes:    PERRL, conjunctiva/corneas clear, EOM's intact, fundi    benign, both eyes  Ears:    Normal TM's and external ear canals, both ears  Nose:   Nares normal, septum midline, mucosa normal, no drainage    or sinus tenderness  Throat:   Lips, mucosa, and tongue normal; teeth and gums normal  Neck:   Supple, symmetrical, trachea midline, no adenopathy;    thyroid:  no enlargement/tenderness/nodules; no carotid   bruit or JVD  Back:     Symmetric, no curvature, ROM normal, no CVA tenderness  Lungs:     Clear to auscultation bilaterally, respirations unlabored  Chest Wall:    No tenderness or deformity   Heart:    Normal heart rate. Normal rhythm. No murmurs, rubs, or gallops.   Breast Exam:    normal appearance, no masses or tenderness  Abdomen:     Soft, non-tender, bowel sounds active all four quadrants,    no masses, no organomegaly  Pelvic:    deferred  Extremities:   All extremities are intact. No cyanosis or edema  Pulses:   2+ and symmetric all extremities  Skin:   Skin  color, texture, turgor normal, no rashes or lesions  Lymph nodes:   Cervical, supraclavicular, and axillary nodes normal  Neurologic:   CNII-XII intact, normal strength, sensation and reflexes    throughout    Depression Screen PHQ 2/9 Scores 08/14/2019 08/09/2018 08/03/2017 07/31/2015  PHQ - 2 Score 0 0 0 0  PHQ- 9 Score 0 0 0 -       Assessment &  Plan:     Routine Health Maintenance and Physical Exam  Exercise Activities and Dietary recommendations Goals   None     Immunization History  Administered Date(s) Administered  . Influenza,inj,Quad PF,6+ Mos 07/31/2015, 08/02/2016, 08/03/2017, 08/09/2018  . Td 08/09/2018  . Tdap 10/24/2007    Health Maintenance  Topic Date Due  . HIV Screening  10/09/1969  . INFLUENZA VACCINE  05/05/2019  . MAMMOGRAM  09/15/2020  . COLONOSCOPY  08/15/2021  . TETANUS/TDAP  08/09/2028  . Hepatitis C Screening  Completed     Discussed health benefits of physical activity, and encouraged her to engage in regular exercise appropriate for her age and condition.     1. Annual physical exam Doing well, normal exam. Had labs drawn this morning.   2. Need for influenza vaccination  - Flu Vaccine QUAD 6+ mos PF IM (Fluarix Quad PF)  Counseled on recommendations for Shingrix and Tdap   The entirety of the information documented in the History of Present Illness, Review of Systems and Physical Exam were personally obtained by me. Portions of this information were initially documented by Awilda Bill, CMA and reviewed by me for thoroughness and accuracy.    [name], MD  [facility]

## 2019-08-15 LAB — CBC
Hematocrit: 39 % (ref 34.0–46.6)
Hemoglobin: 13.5 g/dL (ref 11.1–15.9)
MCH: 30.6 pg (ref 26.6–33.0)
MCHC: 34.6 g/dL (ref 31.5–35.7)
MCV: 88 fL (ref 79–97)
Platelets: 270 10*3/uL (ref 150–450)
RBC: 4.41 x10E6/uL (ref 3.77–5.28)
RDW: 12.8 % (ref 11.7–15.4)
WBC: 6 10*3/uL (ref 3.4–10.8)

## 2019-08-15 LAB — LIPID PANEL
Chol/HDL Ratio: 3.6 ratio (ref 0.0–4.4)
Cholesterol, Total: 182 mg/dL (ref 100–199)
HDL: 50 mg/dL (ref >39)
LDL Chol Calc (NIH): 120 mg/dL — ABNORMAL HIGH (ref 0–99)
Triglycerides: 64 mg/dL (ref 0–149)
VLDL Cholesterol Cal: 12 mg/dL (ref 5–40)

## 2019-08-15 LAB — COMPREHENSIVE METABOLIC PANEL
ALT: 15 IU/L (ref 0–32)
AST: 20 IU/L (ref 0–40)
Albumin/Globulin Ratio: 1.8 (ref 1.2–2.2)
Albumin: 4.6 g/dL (ref 3.8–4.8)
Alkaline Phosphatase: 59 IU/L (ref 39–117)
BUN/Creatinine Ratio: 19 (ref 12–28)
BUN: 12 mg/dL (ref 8–27)
Bilirubin Total: 0.4 mg/dL (ref 0.0–1.2)
CO2: 24 mmol/L (ref 20–29)
Calcium: 9.2 mg/dL (ref 8.7–10.3)
Chloride: 104 mmol/L (ref 96–106)
Creatinine, Ser: 0.64 mg/dL (ref 0.57–1.00)
GFR calc Af Amer: 109 mL/min/{1.73_m2} (ref >59)
GFR calc non Af Amer: 95 mL/min/{1.73_m2} (ref >59)
Globulin, Total: 2.5 g/dL (ref 1.5–4.5)
Glucose: 86 mg/dL (ref 65–99)
Potassium: 4.3 mmol/L (ref 3.5–5.2)
Sodium: 141 mmol/L (ref 134–144)
Total Protein: 7.1 g/dL (ref 6.0–8.5)

## 2019-08-16 ENCOUNTER — Other Ambulatory Visit: Payer: Self-pay | Admitting: Family Medicine

## 2019-08-16 DIAGNOSIS — Z1231 Encounter for screening mammogram for malignant neoplasm of breast: Secondary | ICD-10-CM

## 2019-09-17 ENCOUNTER — Ambulatory Visit
Admission: RE | Admit: 2019-09-17 | Discharge: 2019-09-17 | Disposition: A | Discharge status: Home / Self Care | Payer: PRIVATE HEALTH INSURANCE | Source: Ambulatory Visit | Attending: Family Medicine | Admitting: Family Medicine

## 2019-09-17 DIAGNOSIS — Z1231 Encounter for screening mammogram for malignant neoplasm of breast: Secondary | ICD-10-CM | POA: Diagnosis not present

## 2020-08-13 ENCOUNTER — Telehealth: Payer: Self-pay

## 2020-08-13 DIAGNOSIS — Z136 Encounter for screening for cardiovascular disorders: Secondary | ICD-10-CM

## 2020-08-13 DIAGNOSIS — Z13228 Encounter for screening for other metabolic disorders: Secondary | ICD-10-CM

## 2020-08-13 NOTE — Telephone Encounter (Signed)
Copied from CRM [id number]. Topic: General - Other >> Aug 13, 2020  1:58 PM Marylen Ponto wrote: Reason for CRM: Pt requests to have an order for labs to be drawn the morning of 08/19/20 before her appt. Pt requests call back to advise

## 2020-08-14 NOTE — Telephone Encounter (Signed)
Future order placed. Needs to be fasting.

## 2020-08-14 NOTE — Telephone Encounter (Signed)
Patient advised and verbalized understanding 

## 2020-08-15 ENCOUNTER — Encounter: Payer: PRIVATE HEALTH INSURANCE | Admitting: Family Medicine

## 2020-08-19 ENCOUNTER — Encounter: Payer: Self-pay | Admitting: Family Medicine

## 2020-08-19 ENCOUNTER — Ambulatory Visit (INDEPENDENT_AMBULATORY_CARE_PROVIDER_SITE_OTHER): Payer: Medicare Other | Admitting: Family Medicine

## 2020-08-19 ENCOUNTER — Other Ambulatory Visit: Payer: Self-pay

## 2020-08-19 VITALS — BP 119/73 | HR 67 | Temp 98.4°F | Resp 16 | Ht 63.0 in | Wt 119.0 lb

## 2020-08-19 DIAGNOSIS — Z136 Encounter for screening for cardiovascular disorders: Secondary | ICD-10-CM

## 2020-08-19 DIAGNOSIS — E785 Hyperlipidemia, unspecified: Secondary | ICD-10-CM

## 2020-08-19 DIAGNOSIS — Z0000 Encounter for general adult medical examination without abnormal findings: Secondary | ICD-10-CM

## 2020-08-19 DIAGNOSIS — E2839 Other primary ovarian failure: Secondary | ICD-10-CM

## 2020-08-19 DIAGNOSIS — Z23 Encounter for immunization: Secondary | ICD-10-CM | POA: Diagnosis not present

## 2020-08-19 DIAGNOSIS — Z13228 Encounter for screening for other metabolic disorders: Secondary | ICD-10-CM

## 2020-08-19 NOTE — Progress Notes (Addendum)
Medicare Initial Preventative Physical Exam    Patient: Adrienne Herring, Female    DOB: 14-Apr-1956, 64 y.o.   MRN: [id number] Visit Date: 08/19/2020  Today's Provider: [name], MD   Chief Complaint  Patient presents with  . Medicare Wellness   Subjective    Medicare Initial Preventative Physical Exam Adrienne Herring is a 64 y.o. female who presents today for her Initial Preventative Physical Exam.   HPI  Social History   Socioeconomic History  . Marital status: Married    Spouse name: Not on file  . Number of children: 2  . Years of education: Not on file  . Highest education level: Not on file  Occupational History  . Not on file  Tobacco Use  . Smoking status: Never Smoker  . Smokeless tobacco: Never Used  Substance and Sexual Activity  . Alcohol use: No  . Drug use: No  . Sexual activity: Not on file  Other Topics Concern  . Not on file  Social History Narrative  . Not on file   Social Determinants of Health   Financial Resource Strain:   . Difficulty of Paying Living Expenses: Not on file  Food Insecurity:   . Worried About Programme researcher, broadcasting/film/video in the Last Year: Not on file  . Ran Out of Food in the Last Year: Not on file  Transportation Needs:   . Lack of Transportation (Medical): Not on file  . Lack of Transportation (Non-Medical): Not on file  Physical Activity:   . Days of Exercise per Week: Not on file  . Minutes of Exercise per Session: Not on file  Stress:   . Feeling of Stress : Not on file  Social Connections:   . Frequency of Communication with Friends and Family: Not on file  . Frequency of Social Gatherings with Friends and Family: Not on file  . Attends Religious Services: Not on file  . Active Member of Clubs or Organizations: Not on file  . Attends Banker Meetings: Not on file  . Marital Status: Not on file  Intimate Partner Violence:   . Fear of Current or Ex-Partner: Not on file  . Emotionally Abused: Not on file   . Physically Abused: Not on file  . Sexually Abused: Not on file    No past medical history on file.   Patient Active Problem List   Diagnosis Date Noted  . H/O female genital system disorder 07/30/2015  . Family history of breast cancer 07/30/2015  . Family history of malignant neoplasm of ovary 07/30/2015    Past Surgical History:  Procedure Laterality Date  . ABDOMINAL HYSTERECTOMY  1980   due to endometriosis, still has ovaries    Her family history includes Alcohol abuse in her mother; Breast cancer (age of onset: 58) in her sister; Diabetes in her maternal grandfather; Ovarian cancer in her maternal aunt.   Current Outpatient Medications:  .  Ascorbic Acid (VITAMIN C) 100 MG tablet, , Disp: , Rfl:  .  Calcium Carb-Cholecalciferol (CALCIUM 600 + D PO), Take 1 tablet by mouth 3 (three) times daily., Disp: , Rfl:  .  Cholecalciferol 25 MCG (1000 UT) tablet, Take 1,000 Units by mouth daily., Disp: , Rfl:    Patient Care Team: Malva Limes, MD as PCP - General (Family Medicine)  Review of Systems  Constitutional: Negative for appetite change, chills, fatigue and fever.  HENT: Negative for congestion, ear pain, rhinorrhea, sneezing and sore throat.  Eyes: Negative.  Negative for pain and redness.  Respiratory: Negative for cough, chest tightness, shortness of breath and wheezing.   Cardiovascular: Negative for chest pain, palpitations and leg swelling.  Gastrointestinal: Negative for abdominal pain, blood in stool, constipation, diarrhea, nausea and vomiting.  Endocrine: Negative for polydipsia and polyphagia.  Genitourinary: Negative.  Negative for dysuria, flank pain, hematuria, pelvic pain, vaginal bleeding and vaginal discharge.  Musculoskeletal: Negative for arthralgias, back pain, gait problem and joint swelling.  Skin: Negative for rash.  Neurological: Negative.  Negative for dizziness, tremors, seizures, weakness, light-headedness, numbness and headaches.    Hematological: Negative for adenopathy.  Psychiatric/Behavioral: Negative.  Negative for behavioral problems, confusion and dysphoric mood. The patient is not nervous/anxious and is not hyperactive.        Objective    Vitals: BP 119/73 (BP Location: Right Arm, Patient Position: Sitting, Cuff Size: Normal)   Pulse 67   Temp 98.4 F (36.9 C) (Oral)   Resp 16   Ht 5\' 3"  (1.6 m)   Wt 119 lb (54 kg)   BMI 21.08 kg/m   Hearing Screening   125Hz  250Hz  500Hz  1000Hz  2000Hz  3000Hz  4000Hz  6000Hz  8000Hz   Right ear:           Left ear:             Visual Acuity Screening   Right eye Left eye Both eyes  Without correction: 20/100 20/25 20/25  With correction:     Comments: Patient saw all colors  Physical Exam  General Appearance:    Well developed, well nourished female. Alert, cooperative, in no acute distress, appears stated age   Head:    Normocephalic, without obvious abnormality, atraumatic  Eyes:    PERRL, conjunctiva/corneas clear, EOM's intact, fundi    benign, both eyes  Ears:    Normal TM's and external ear canals, both ears  Neck:   Supple, symmetrical, trachea midline, no adenopathy;    thyroid:  no enlargement/tenderness/nodules; no carotid   bruit or JVD  Back:     Symmetric, no curvature, ROM normal, no CVA tenderness  Lungs:     Clear to auscultation bilaterally, respirations unlabored  Chest Wall:    No tenderness or deformity   Heart:    Normal heart rate. Normal rhythm. No murmurs, rubs, or gallops.   Breast Exam:    normal appearance, no masses or tenderness  Abdomen:     Soft, non-tender, bowel sounds active all four quadrants,    no masses, no organomegaly  Pelvic:    deferred  Extremities:   All extremities are intact. No cyanosis or edema  Pulses:   2+ and symmetric all extremities  Skin:   Skin color, texture, turgor normal, no rashes or lesions  Lymph nodes:   Cervical, supraclavicular, and axillary nodes normal  Neurologic:   CNII-XII intact, normal  strength, sensation and reflexes    throughout    Activities of Daily Living In your present state of health, do you have any difficulty performing the following activities: 08/19/2020  Hearing? N  Vision? N  Difficulty concentrating or making decisions? N  Walking or climbing stairs? N  Dressing or bathing? N  Doing errands, shopping? N  Some recent data might be hidden    Fall Risk Assessment Fall Risk  08/19/2020 08/03/2017 07/31/2015  Falls in the past year? 0 No No  Number falls in past yr: 0 - -  Injury with Fall? 0 - -  Follow up Falls evaluation  completed - -     Depression Screen PHQ 2/9 Scores 08/19/2020 08/14/2019 08/09/2018 08/03/2017  PHQ - 2 Score 0 0 0 0  PHQ- 9 Score 0 0 0 0    6CIT Screen 08/19/2020  What Year? 0 points  What month? 0 points  What time? 0 points  Count back from 20 0 points  Months in reverse 0 points  Repeat phrase 0 points  Total Score 0    No results found for any visits on 08/19/20.  Assessment & Plan      Initial Preventative Physical Exam  Reviewed patient's Family Medical History Reviewed and updated list of patient's medical providers Assessment of cognitive impairment was done Assessed patient's functional ability Established a written schedule for health screening services Health Risk Assessent Completed and Reviewed  Exercise Activities and Dietary recommendations Goals   None     Immunization History  Administered Date(s) Administered  . Fluad Quad(high Dose 65+) 08/19/2020  . Influenza,inj,Quad PF,6+ Mos 07/31/2015, 08/02/2016, 08/03/2017, 08/09/2018, 08/14/2019  . Janssen (J&J) SARS-COV-2 Vaccination 01/09/2020, 07/30/2020  . Pneumococcal Polysaccharide-23 08/19/2020  . Td 08/09/2018  . Tdap 10/24/2007    Health Maintenance  Topic Date Due  . HIV Screening  Never done  . DEXA SCAN  Never done  . COLONOSCOPY  08/15/2021  . PNA vac Low Risk Adult (2 of 2 - PCV13) 08/19/2021  . MAMMOGRAM  09/16/2021    . TETANUS/TDAP  08/09/2028  . INFLUENZA VACCINE  Completed  . COVID-19 Vaccine  Completed  . Hepatitis C Screening  Completed     Discussed health benefits of physical activity, and encouraged her to engage in regular exercise appropriate for her age and condition.   She declined EKG today.   2. Hyperlipidemia.  Doing well managing with diet. Labs pending.     The entirety of the information documented in the History of Present Illness, Review of Systems and Physical Exam were personally obtained by me. Portions of this information were initially documented by the CMA and reviewed by me for thoroughness and accuracy.      [name], MD  [facility] [phone] (phone) [phone] (fax)  [facility]

## 2020-08-19 NOTE — Patient Instructions (Signed)
.   Please review the attached list of medications and notify my office if there are any errors.   . Please bring all of your medications to every appointment so we can make sure that our medication list is the same as yours.   

## 2020-08-20 ENCOUNTER — Telehealth: Payer: Self-pay

## 2020-08-20 ENCOUNTER — Encounter: Payer: Self-pay | Admitting: Family Medicine

## 2020-08-20 DIAGNOSIS — E785 Hyperlipidemia, unspecified: Secondary | ICD-10-CM | POA: Insufficient documentation

## 2020-08-20 LAB — COMPREHENSIVE METABOLIC PANEL
ALT: 80 IU/L — ABNORMAL HIGH (ref 0–32)
AST: 59 IU/L — ABNORMAL HIGH (ref 0–40)
Albumin/Globulin Ratio: 1.6 (ref 1.2–2.2)
Albumin: 4.4 g/dL (ref 3.8–4.8)
Alkaline Phosphatase: 77 IU/L (ref 44–121)
BUN/Creatinine Ratio: 12 (ref 12–28)
BUN: 9 mg/dL (ref 8–27)
Bilirubin Total: 0.5 mg/dL (ref 0.0–1.2)
CO2: 24 mmol/L (ref 20–29)
Calcium: 9.2 mg/dL (ref 8.7–10.3)
Chloride: 106 mmol/L (ref 96–106)
Creatinine, Ser: 0.73 mg/dL (ref 0.57–1.00)
GFR calc Af Amer: 100 mL/min/{1.73_m2} (ref >59)
GFR calc non Af Amer: 87 mL/min/{1.73_m2} (ref >59)
Globulin, Total: 2.8 g/dL (ref 1.5–4.5)
Glucose: 98 mg/dL (ref 65–99)
Potassium: 4.7 mmol/L (ref 3.5–5.2)
Sodium: 141 mmol/L (ref 134–144)
Total Protein: 7.2 g/dL (ref 6.0–8.5)

## 2020-08-20 LAB — LIPID PANEL
Chol/HDL Ratio: 5 ratio — ABNORMAL HIGH (ref 0.0–4.4)
Cholesterol, Total: 174 mg/dL (ref 100–199)
HDL: 35 mg/dL — ABNORMAL LOW (ref >39)
LDL Chol Calc (NIH): 121 mg/dL — ABNORMAL HIGH (ref 0–99)
Triglycerides: 99 mg/dL (ref 0–149)
VLDL Cholesterol Cal: 18 mg/dL (ref 5–40)

## 2020-08-20 NOTE — Telephone Encounter (Signed)
Adrienne Herring patient would like mammo and BMD done on the same day. Thanks!

## 2020-08-20 NOTE — Telephone Encounter (Signed)
Advised patient of results. Patient was wanting her bone density and mammogram done at the same time. Ok to order this? Please advise. Thanks!

## 2020-08-20 NOTE — Telephone Encounter (Signed)
-----   Message from Malva Limes, MD sent at 08/20/2020  8:12 AM EST ----- LDL cholesterol is a little high at 121, should be under 100. HDL (cholesterol) is a little bit low at 35, it should be over 40. Cut back on saturated fats such as red meats and pork. Try to get more UN saturated food which is found in fish and seafood, as well as nuts and legumes. Check yearly

## 2020-08-20 NOTE — Telephone Encounter (Signed)
She should get a call from sarah to schedule the BMD, she should just let sarah know she want's both done on the same day.

## 2020-08-22 ENCOUNTER — Other Ambulatory Visit: Payer: Self-pay | Admitting: Family Medicine

## 2020-08-22 DIAGNOSIS — Z1231 Encounter for screening mammogram for malignant neoplasm of breast: Secondary | ICD-10-CM

## 2020-09-25 ENCOUNTER — Other Ambulatory Visit: Payer: Self-pay

## 2020-09-25 ENCOUNTER — Ambulatory Visit
Admission: RE | Admit: 2020-09-25 | Discharge: 2020-09-25 | Disposition: A | Discharge status: Home / Self Care | Payer: Medicare Other | Source: Ambulatory Visit | Attending: Family Medicine | Admitting: Family Medicine

## 2020-09-25 DIAGNOSIS — E2839 Other primary ovarian failure: Secondary | ICD-10-CM | POA: Diagnosis not present

## 2020-09-25 DIAGNOSIS — Z1231 Encounter for screening mammogram for malignant neoplasm of breast: Secondary | ICD-10-CM | POA: Insufficient documentation

## 2020-09-28 ENCOUNTER — Encounter: Payer: Self-pay | Admitting: Family Medicine

## 2020-09-28 DIAGNOSIS — M8580 Other specified disorders of bone density and structure, unspecified site: Secondary | ICD-10-CM | POA: Insufficient documentation

## 2021-08-24 ENCOUNTER — Ambulatory Visit: Payer: Medicare Other | Admitting: Family Medicine

## 2021-08-31 ENCOUNTER — Other Ambulatory Visit: Payer: Self-pay

## 2021-08-31 ENCOUNTER — Ambulatory Visit (INDEPENDENT_AMBULATORY_CARE_PROVIDER_SITE_OTHER): Payer: Medicare Other | Admitting: Family Medicine

## 2021-08-31 ENCOUNTER — Encounter: Payer: Self-pay | Admitting: Family Medicine

## 2021-08-31 VITALS — BP 117/74 | HR 59 | Temp 98.0°F | Ht 63.0 in | Wt 118.0 lb

## 2021-08-31 DIAGNOSIS — Z1211 Encounter for screening for malignant neoplasm of colon: Secondary | ICD-10-CM | POA: Diagnosis not present

## 2021-08-31 DIAGNOSIS — M25552 Pain in left hip: Secondary | ICD-10-CM

## 2021-08-31 DIAGNOSIS — Z23 Encounter for immunization: Secondary | ICD-10-CM | POA: Diagnosis not present

## 2021-08-31 DIAGNOSIS — Z0000 Encounter for general adult medical examination without abnormal findings: Secondary | ICD-10-CM

## 2021-08-31 DIAGNOSIS — E785 Hyperlipidemia, unspecified: Secondary | ICD-10-CM | POA: Diagnosis not present

## 2021-08-31 DIAGNOSIS — Z1231 Encounter for screening mammogram for malignant neoplasm of breast: Secondary | ICD-10-CM | POA: Diagnosis not present

## 2021-08-31 NOTE — Progress Notes (Signed)
Annual Wellness Visit     Patient: Adrienne Herring, Female    DOB: 06-27-1956, 65 y.o.   MRN: [id number] Visit Date: 08/31/2021  Today's Provider: [name], MD   Chief Complaint  Patient presents with   Annual Exam   Subjective    Adrienne Herring is a 65 y.o. female who presents today for her Annual Wellness Visit. She reports consuming a general diet. The patient has a physically strenuous job, but has no regular exercise apart from work.  She generally feels well. She reports sleeping well. She does have additional problems to discuss today.   She states she occasionally gets discomfort going around left hip into inguinal area when moving hip and leg in certain positions. It is not every day, but feels a pop in the inguinal area when it occurs. No known injury. No swelling, no masses.     Medications: Outpatient Medications Prior to Visit  Medication Sig   Ascorbic Acid (VITAMIN C) 100 MG tablet    Calcium Carb-Cholecalciferol (CALCIUM 600 + D PO) Take 1 tablet by mouth 3 (three) times daily.   Cholecalciferol 25 MCG (1000 UT) tablet Take 1,000 Units by mouth daily.   No facility-administered medications prior to visit.    No Known Allergies  Patient Care Team: Malva Limes, MD as PCP - General (Family Medicine)  Review of Systems      Objective    Vitals: BP 117/74 (BP Location: Right Arm, Patient Position: Sitting, Cuff Size: Normal)   Pulse (!) 59   Temp 98 F (36.7 C) (Oral)   Ht 5\' 3"  (1.6 m)   Wt 118 lb (53.5 kg)   SpO2 100%   BMI 20.90 kg/m    Physical Exam   General: Appearance:    Well developed, well nourished female in no acute distress  Breast exam   No masses, tenderness, or discharge. Symmetric. No palpable lymph nodes. Normal overall exam.   Eyes:    PERRL, conjunctiva/corneas clear, EOM's intact       Lungs:     Clear to auscultation bilaterally, respirations unlabored  Heart:    Bradycardic. Normal rhythm. No murmurs, rubs, or  gallops.    MS:   All extremities are intact.  FROM of left hip, no tenderness.   Neurologic:   Awake, alert, oriented x 3. No apparent focal neurological defect.        Most recent functional status assessment: No flowsheet data found. Most recent fall risk assessment: Fall Risk  08/19/2020  Falls in the past year? 0  Number falls in past yr: 0  Injury with Fall? 0  Follow up Falls evaluation completed    Most recent depression screenings: PHQ 2/9 Scores 08/19/2020 08/14/2019  PHQ - 2 Score 0 0  PHQ- 9 Score 0 0   Most recent cognitive screening: 6CIT Screen 08/31/2021  What Year? 0 points  What month? 0 points  What time? 0 points  Count back from 20 0 points  Months in reverse 0 points  Repeat phrase 0 points  Total Score 0   Most recent Audit-C alcohol use screening Alcohol Use Disorder Test (AUDIT) 08/19/2020  1. How often do you have a drink containing alcohol? 0  2. How many drinks containing alcohol do you have on a typical day when you are drinking? 0  3. How often do you have six or more drinks on one occasion? 0  AUDIT-C Score 0  Alcohol Brief Interventions/Follow-up  AUDIT Score <7 follow-up not indicated   A score of 3 or more in women, and 4 or more in men indicates increased risk for alcohol abuse, EXCEPT if all of the points are from question 1   No results found for any visits on 08/31/21.  Assessment & Plan     Annual wellness visit done today including the all of the following: Reviewed patient's Family Medical History Reviewed and updated list of patient's medical providers Assessment of cognitive impairment was done Assessed patient's functional ability Established a written schedule for health screening services Health Risk Assessent Completed and Reviewed  Exercise Activities and Dietary recommendations  Goals   None     Immunization History  Administered Date(s) Administered   Fluad Quad(high Dose 65+) 08/19/2020   Influenza,inj,Quad  PF,6+ Mos 07/31/2015, 08/02/2016, 08/03/2017, 08/09/2018, 08/14/2019   Janssen (J&J) SARS-COV-2 Vaccination 01/09/2020, 07/30/2020   Pneumococcal Polysaccharide-23 08/19/2020   Td 08/09/2018   Tdap 10/24/2007    Health Maintenance  Topic Date Due   Zoster Vaccines- Shingrix (1 of 2) Never done   COVID-19 Vaccine (3 - Booster for Janssen series) 09/24/2020   INFLUENZA VACCINE  05/04/2021   COLONOSCOPY (Pts 45-46yrs Insurance coverage will need to be confirmed)  08/15/2021   Pneumonia Vaccine 19+ Years old (2 - PCV) 08/19/2021   MAMMOGRAM  09/25/2022   DEXA SCAN  09/25/2022   TETANUS/TDAP  08/09/2028   Hepatitis C Screening  Completed   HPV VACCINES  Aged Out     Discussed health benefits of physical activity, and encouraged her to engage in regular exercise appropriate for her age and condition.    1. Screening for colon cancer  - Ambulatory referral to Gastroenterology  2. Encounter for screening mammogram for malignant neoplasm of breast  - MM 3D SCREEN BREAST BILATERAL  3. Hyperlipidemia, unspecified hyperlipidemia type Diet controlled.  - Comprehensive metabolic panel - Lipid panel  4. Need for influenza vaccination  - Flu Vaccine QUAD High Dose IM (Fluad)  5. Need for vaccination against Streptococcus pneumoniae  - Pneumococcal conjugate vaccine 20-valent (Preferred)  6. Hip pain, left Mild and intermittent. Normal exam. By history is most consistent with soft tissue strain. Advised we could get xray hip if it becomes more persistent.       The entirety of the information documented in the History of Present Illness, Review of Systems and Physical Exam were personally obtained by me. Portions of this information were initially documented by the CMA and reviewed by me for thoroughness and accuracy.     [name], MD  [facility] [phone] (phone) [phone] (fax)  [facility]

## 2021-09-01 LAB — COMPREHENSIVE METABOLIC PANEL
ALT: 13 IU/L (ref 0–32)
AST: 20 IU/L (ref 0–40)
Albumin/Globulin Ratio: 1.8 (ref 1.2–2.2)
Albumin: 4.5 g/dL (ref 3.8–4.8)
Alkaline Phosphatase: 56 IU/L (ref 44–121)
BUN/Creatinine Ratio: 17 (ref 12–28)
BUN: 11 mg/dL (ref 8–27)
Bilirubin Total: 0.4 mg/dL (ref 0.0–1.2)
CO2: 24 mmol/L (ref 20–29)
Calcium: 9 mg/dL (ref 8.7–10.3)
Chloride: 105 mmol/L (ref 96–106)
Creatinine, Ser: 0.65 mg/dL (ref 0.57–1.00)
Globulin, Total: 2.5 g/dL (ref 1.5–4.5)
Glucose: 90 mg/dL (ref 70–99)
Potassium: 4.3 mmol/L (ref 3.5–5.2)
Sodium: 142 mmol/L (ref 134–144)
Total Protein: 7 g/dL (ref 6.0–8.5)
eGFR: 97 mL/min/{1.73_m2} (ref >59)

## 2021-09-01 LAB — LIPID PANEL
Chol/HDL Ratio: 4.1 ratio (ref 0.0–4.4)
Cholesterol, Total: 197 mg/dL (ref 100–199)
HDL: 48 mg/dL (ref >39)
LDL Chol Calc (NIH): 135 mg/dL — ABNORMAL HIGH (ref 0–99)
Triglycerides: 75 mg/dL (ref 0–149)
VLDL Cholesterol Cal: 14 mg/dL (ref 5–40)

## 2021-09-02 ENCOUNTER — Telehealth: Payer: Self-pay

## 2021-09-02 NOTE — Telephone Encounter (Signed)
Pt advised.   Thanks,   -Glorianna Gott  

## 2021-09-02 NOTE — Telephone Encounter (Signed)
Copied from CRM [id number]. Topic: General - Other >> Sep 02, 2021  9:53 AM Wyonia Hough E wrote: Reason for CRM: Pt would like a call to go over her lab results/ please advise

## 2021-09-29 ENCOUNTER — Other Ambulatory Visit: Payer: Self-pay

## 2021-09-29 ENCOUNTER — Ambulatory Visit
Admission: RE | Admit: 2021-09-29 | Discharge: 2021-09-29 | Disposition: A | Discharge status: Home / Self Care | Payer: Medicare Other | Source: Ambulatory Visit | Attending: Family Medicine | Admitting: Family Medicine

## 2021-09-29 DIAGNOSIS — Z1231 Encounter for screening mammogram for malignant neoplasm of breast: Secondary | ICD-10-CM | POA: Diagnosis not present

## 2022-01-13 DIAGNOSIS — Z1211 Encounter for screening for malignant neoplasm of colon: Secondary | ICD-10-CM | POA: Diagnosis not present

## 2022-01-13 DIAGNOSIS — K640 First degree hemorrhoids: Secondary | ICD-10-CM | POA: Diagnosis not present

## 2022-01-13 DIAGNOSIS — K589 Irritable bowel syndrome without diarrhea: Secondary | ICD-10-CM | POA: Diagnosis not present

## 2022-01-13 DIAGNOSIS — K5730 Diverticulosis of large intestine without perforation or abscess without bleeding: Secondary | ICD-10-CM | POA: Diagnosis not present

## 2022-01-13 LAB — HM COLONOSCOPY

## 2022-06-23 DIAGNOSIS — H18891 Other specified disorders of cornea, right eye: Secondary | ICD-10-CM | POA: Diagnosis not present

## 2022-06-23 DIAGNOSIS — H2513 Age-related nuclear cataract, bilateral: Secondary | ICD-10-CM | POA: Diagnosis not present

## 2022-08-15 IMAGING — MG MM DIGITAL SCREENING BILAT W/ TOMO AND CAD
8 series · 8 of 24 positions shown · non-contrast
Comparison: Previous exam(s).

CLINICAL DATA: Screening.

EXAM:
DIGITAL SCREENING BILATERAL MAMMOGRAM WITH TOMOSYNTHESIS AND CAD
TECHNIQUE: Bilateral screening digital craniocaudal and mediolateral oblique
mammograms were obtained. Bilateral screening digital breast
tomosynthesis was performed. The images were evaluated with
computer-aided detection.

[R CC synth-2D]
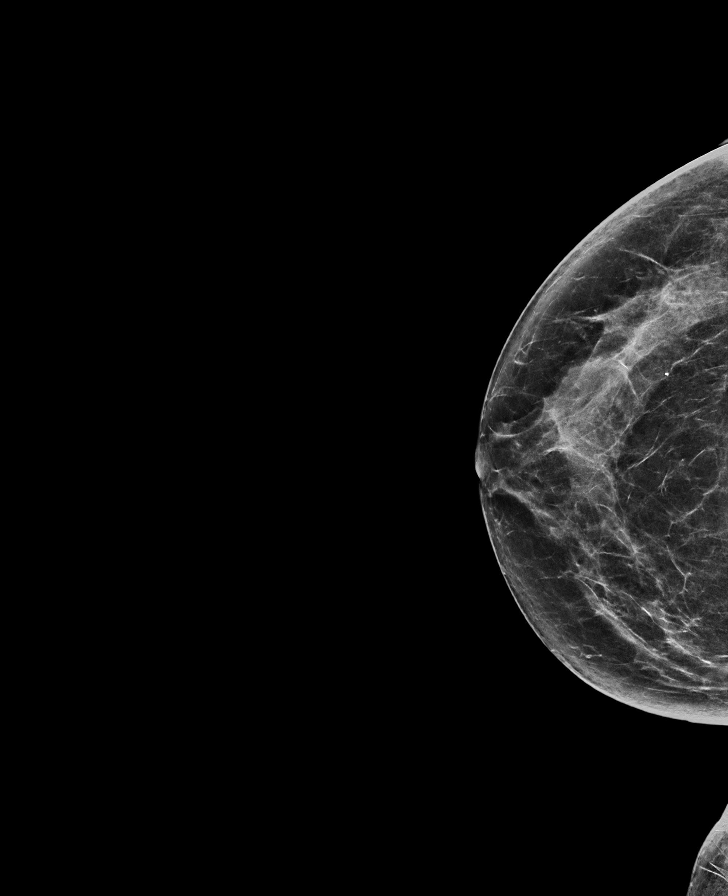

[L MLO synth-2D]
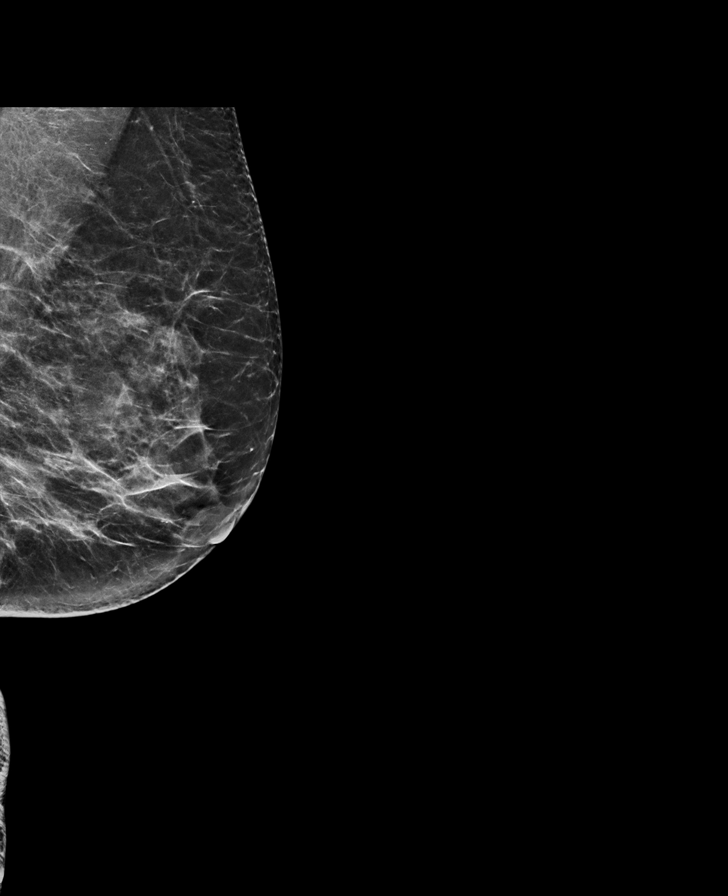

[L CC synth-2D]
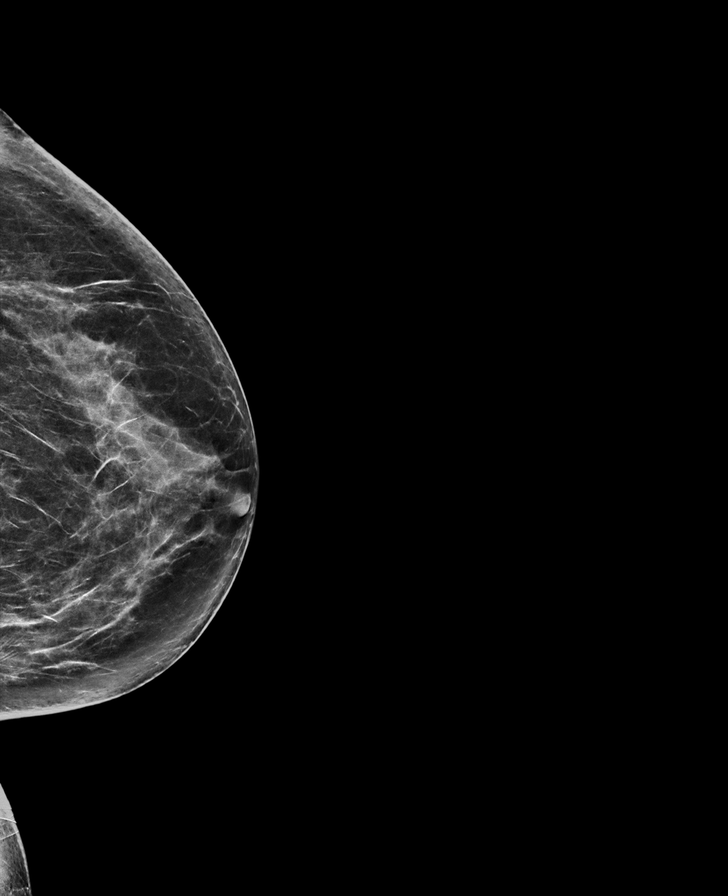

[R MLO synth-2D]
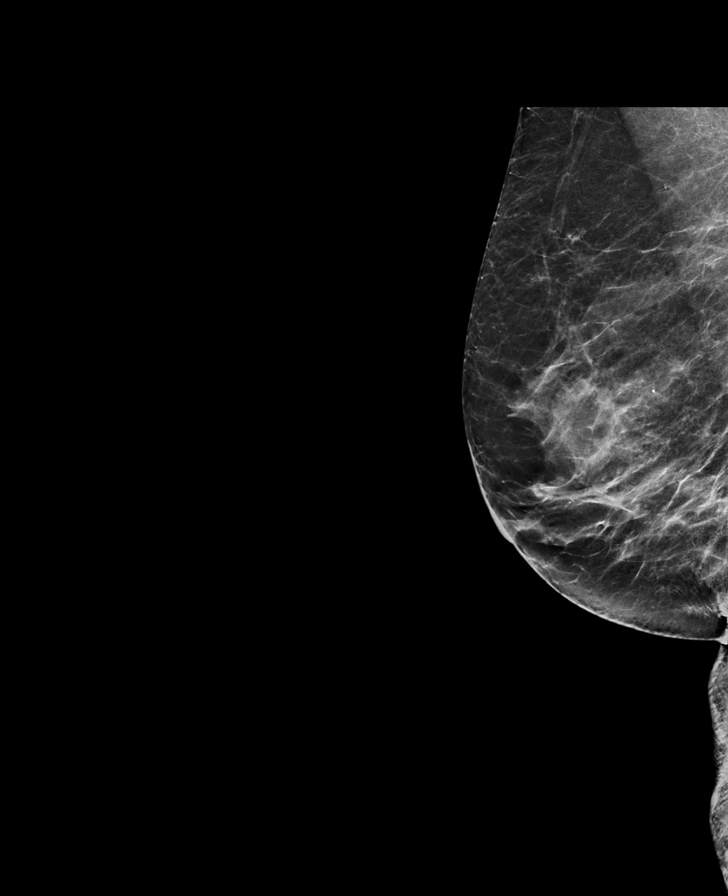

[R CC tomo · tomo slice 35/69.0]
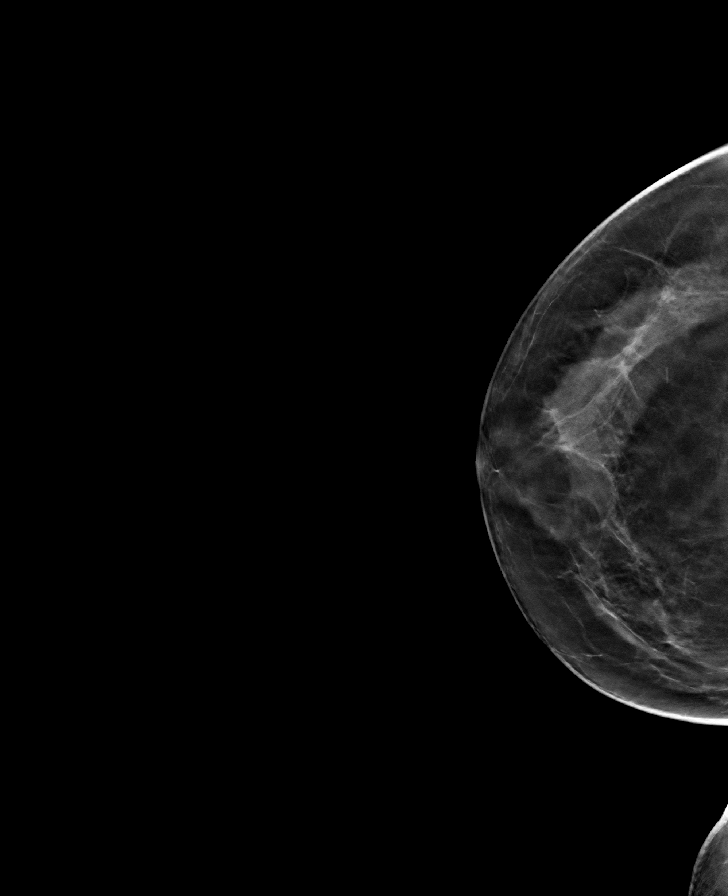

[L CC tomo · tomo slice 35/70.0]
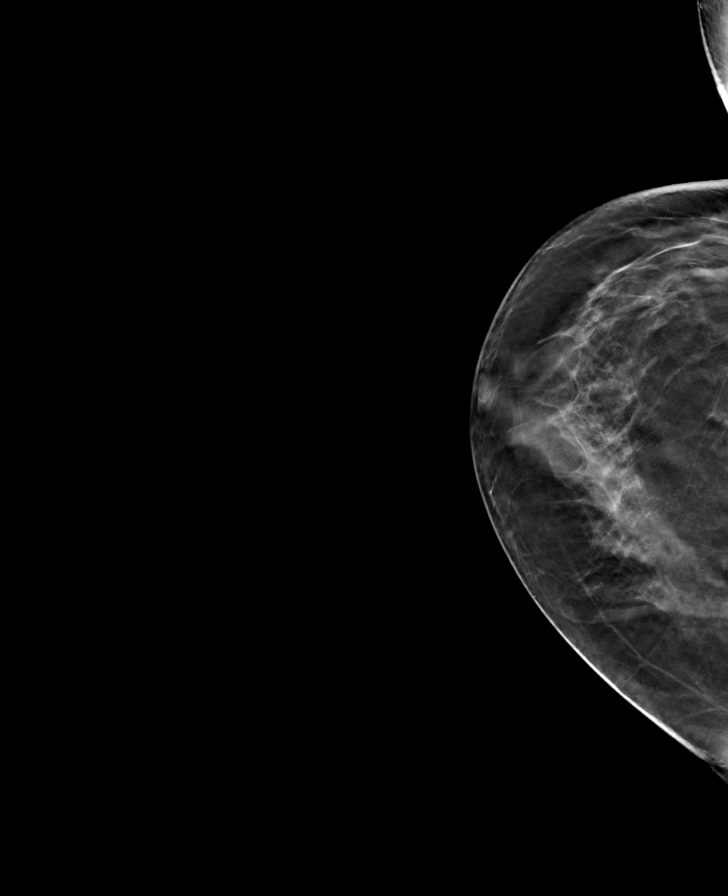

[R MLO tomo · tomo slice 33/66.0]
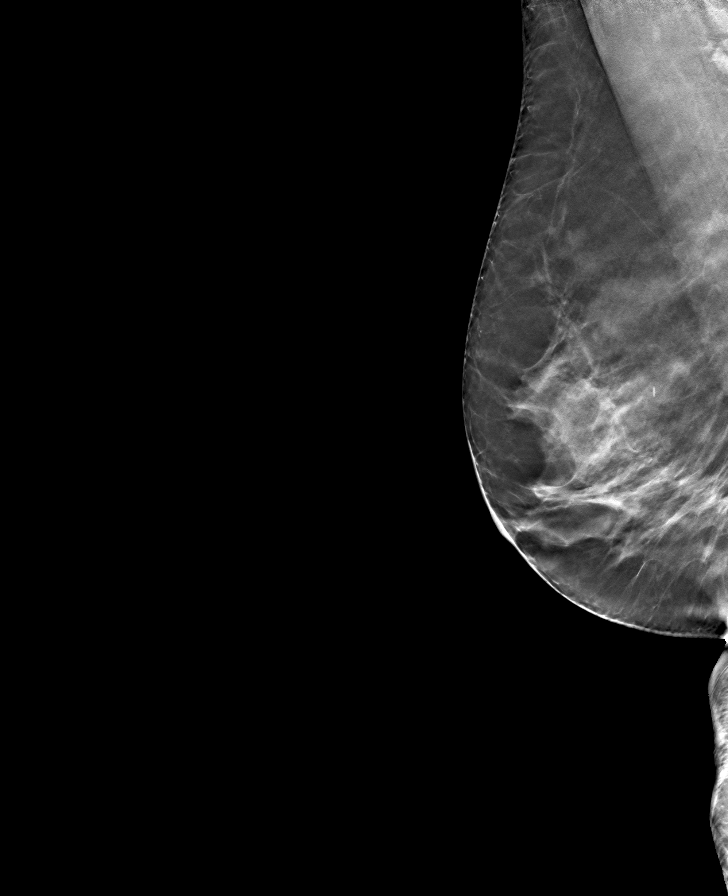

[L MLO tomo · tomo slice 35/68.0]
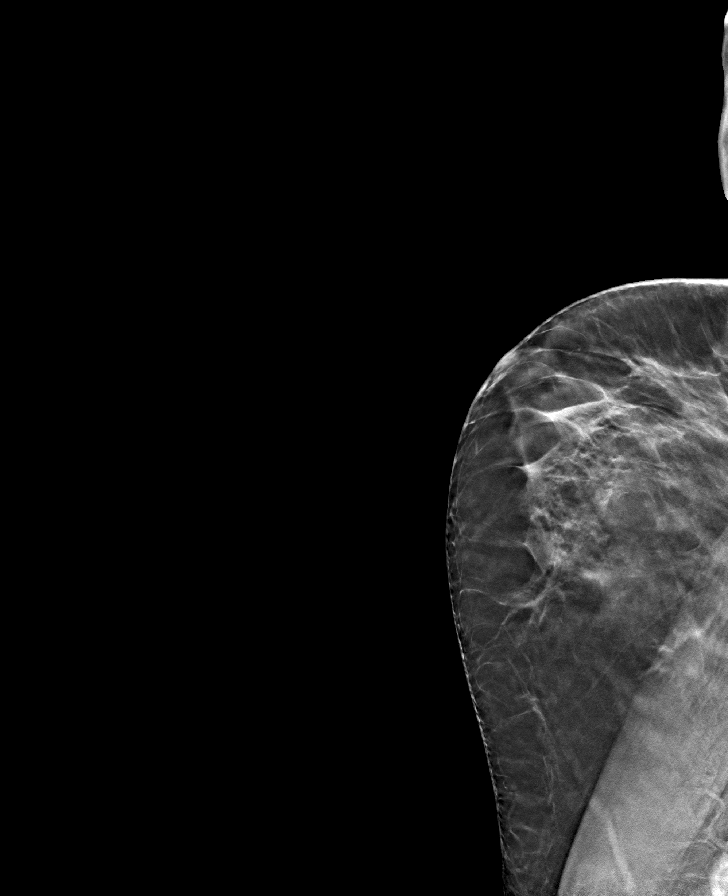

[8 of 24 positions shown; findings below may reference images not displayed]

ACR Breast Density Category c: The breast tissue is heterogeneously
dense, which may obscure small masses.
FINDINGS: There are no findings suspicious for malignancy.
IMPRESSION: No mammographic evidence of malignancy. A result letter of this
screening mammogram will be mailed directly to the patient.

RECOMMENDATION:
Screening mammogram in one year. (Code:Q3-W-BC3)

BI-RADS CATEGORY  1: Negative.

## 2022-08-16 ENCOUNTER — Other Ambulatory Visit: Payer: Self-pay | Admitting: Family Medicine

## 2022-08-16 DIAGNOSIS — Z1231 Encounter for screening mammogram for malignant neoplasm of breast: Secondary | ICD-10-CM

## 2022-08-23 ENCOUNTER — Telehealth: Payer: Self-pay

## 2022-08-23 DIAGNOSIS — Z0000 Encounter for general adult medical examination without abnormal findings: Secondary | ICD-10-CM

## 2022-08-23 NOTE — Telephone Encounter (Signed)
Copied from CRM [id number]. Topic: Appointment Scheduling - Scheduling Inquiry for Clinic >> Aug 23, 2022  8:38 AM Payton Doughty wrote: Reason for CRM: pt wants to know if she can have lab orders prior to her 11/29 appt w/ Dr Sherrie Mustache?

## 2022-08-24 NOTE — Telephone Encounter (Signed)
Patient advised. Lab order placed up front at [address]. ?

## 2022-08-24 NOTE — Telephone Encounter (Signed)
Lab order placed. Needs to be fasting.

## 2022-08-24 NOTE — Addendum Note (Signed)
Addended by: Malva Limes on: 08/24/2022 12:33 PM   Modules accepted: Orders

## 2022-09-01 ENCOUNTER — Encounter: Payer: Self-pay | Admitting: Family Medicine

## 2022-09-01 ENCOUNTER — Ambulatory Visit (INDEPENDENT_AMBULATORY_CARE_PROVIDER_SITE_OTHER): Payer: Medicare HMO | Admitting: Family Medicine

## 2022-09-01 VITALS — BP 117/72 | HR 56 | Temp 98.5°F | Resp 14 | Ht 63.0 in | Wt 117.0 lb

## 2022-09-01 DIAGNOSIS — Z23 Encounter for immunization: Secondary | ICD-10-CM | POA: Diagnosis not present

## 2022-09-01 DIAGNOSIS — Z1211 Encounter for screening for malignant neoplasm of colon: Secondary | ICD-10-CM

## 2022-09-01 DIAGNOSIS — Z0000 Encounter for general adult medical examination without abnormal findings: Secondary | ICD-10-CM | POA: Diagnosis not present

## 2022-09-01 DIAGNOSIS — M8580 Other specified disorders of bone density and structure, unspecified site: Secondary | ICD-10-CM | POA: Diagnosis not present

## 2022-09-01 DIAGNOSIS — E2839 Other primary ovarian failure: Secondary | ICD-10-CM | POA: Diagnosis not present

## 2022-09-01 DIAGNOSIS — E785 Hyperlipidemia, unspecified: Secondary | ICD-10-CM | POA: Diagnosis not present

## 2022-09-01 NOTE — Patient Instructions (Signed)
.   Please review the attached list of medications and notify my office if there are any errors.   The CDC recommends two doses of Shingrix (the shingles vaccine) separated by 2 to 6 months for adults age 67 years and older. I recommend checking with your pharmacy plan regarding coverage for this vaccine.  .    

## 2022-09-01 NOTE — Progress Notes (Signed)
I,Adrienne Herring,acting as a scribe for Adrienne Merry, MD.,have documented all relevant documentation on the behalf of Adrienne Merry, MD,as directed by  Adrienne Merry, MD while in the presence of Adrienne Merry, MD.    Annual Wellness Visit     Patient: Adrienne Herring, Female    DOB: 1955/11/06, 66 y.o.   MRN: [id number] Visit Date: 09/01/2022  Today's Provider: [name], MD   Chief Complaint  Patient presents with   Medicare Wellness   Subjective    Adrienne Herring is a 66 y.o. female who presents today for her Annual Wellness Visit. She reports consuming a general diet. The patient has a physically strenuous job, but has no regular exercise apart from work.  She generally feels fairly well. She reports sleeping fairly well. She does not have additional problems to discuss today.   HPI Last Colonoscopy: 08/16/2011 Last Mammogram: 09/29/2021  Medications: Outpatient Medications Prior to Visit  Medication Sig   Ascorbic Acid (VITAMIN C) 100 MG tablet    Calcium Carb-Cholecalciferol (CALCIUM 600 + D PO) Take 1 tablet by mouth 3 (three) times daily.   Cholecalciferol 25 MCG (1000 UT) tablet Take 1,000 Units by mouth daily.   No facility-administered medications prior to visit.    No Known Allergies  Patient Care Team: Malva Limes, MD as PCP - General (Family Medicine) Jaynie Collins, DO as Consulting Physician (Gastroenterology)  Review of Systems  Constitutional:  Negative for chills, diaphoresis, fatigue and fever.  HENT:  Negative for congestion, ear discharge, ear pain, hearing loss, nosebleeds, rhinorrhea, sneezing, sore throat and tinnitus.   Eyes: Negative.  Negative for photophobia, pain, discharge and redness.  Respiratory:  Negative for cough, shortness of breath, wheezing and stridor.   Cardiovascular:  Negative for chest pain, palpitations and leg swelling.  Gastrointestinal:  Negative for abdominal pain, blood in stool, constipation,  diarrhea, nausea and vomiting.  Endocrine: Negative for polydipsia and polyphagia.  Genitourinary: Negative.  Negative for dysuria, flank pain, frequency, hematuria, pelvic pain, urgency, vaginal bleeding and vaginal discharge.  Musculoskeletal:  Negative for arthralgias, back pain, gait problem, joint swelling, myalgias and neck pain.  Skin:  Negative for rash.  Allergic/Immunologic: Negative for environmental allergies.  Neurological: Negative.  Negative for dizziness, tremors, seizures, weakness, light-headedness, numbness and headaches.  Hematological:  Negative for adenopathy. Does not bruise/bleed easily.  Psychiatric/Behavioral: Negative.  Negative for behavioral problems, confusion, dysphoric mood, hallucinations and suicidal ideas. The patient is not nervous/anxious and is not hyperactive.   All other systems reviewed and are negative.      Objective    Vitals: BP 117/72 (BP Location: Right Arm, Patient Position: Sitting, Cuff Size: Normal)   Pulse (!) 56   Temp 98.5 F (36.9 C) (Oral)   Resp 14   Ht 5\' 3"  (1.6 m)   Wt 117 lb (53.1 kg)   SpO2 100% Comment: room air  BMI 20.73 kg/m     Physical Exam  General Appearance:    Well developed, well nourished female. Alert, cooperative, in no acute distress, appears stated age   Head:    Normocephalic, without obvious abnormality, atraumatic  Eyes:    PERRL, conjunctiva/corneas clear, EOM's intact, fundi    benign, both eyes  Ears:    Normal TM's and external ear canals, both ears  Nose:   Nares normal, septum midline, mucosa normal, no drainage    or sinus tenderness  Throat:   Lips, mucosa, and tongue normal; teeth and  gums normal  Neck:   Supple, symmetrical, trachea midline, no adenopathy;    thyroid:  no enlargement/tenderness/nodules; no carotid   bruit or JVD  Back:     Symmetric, no curvature, ROM normal, no CVA tenderness  Lungs:     Clear to auscultation bilaterally, respirations unlabored  Chest Wall:    No  tenderness or deformity   Heart:    Bradycardic. Normal rhythm. No murmurs, rubs, or gallops.   Breast Exam:    normal appearance, no masses or tenderness  Abdomen:     Soft, non-tender, bowel sounds active all four quadrants,    no masses, no organomegaly  Pelvic:    deferred  Extremities:   All extremities are intact. No cyanosis or edema  Pulses:   2+ and symmetric all extremities  Skin:   Skin color, texture, turgor normal, no rashes or lesions  Lymph nodes:   Cervical, supraclavicular, and axillary nodes normal  Neurologic:   CNII-XII intact, normal strength, sensation and reflexes    throughout     Most recent functional status assessment:    09/01/2022    9:03 AM  In your present state of health, do you have any difficulty performing the following activities:  Hearing? 0  Vision? 0  Difficulty concentrating or making decisions? 0  Walking or climbing stairs? 0  Dressing or bathing? 0  Doing errands, shopping? 0   Most recent fall risk assessment:    09/01/2022    8:58 AM  Fall Risk   Falls in the past year? 0  Number falls in past yr: 0  Injury with Fall? 0  Risk for fall due to : No Fall Risks  Follow up Falls evaluation completed    Most recent depression screenings:    09/01/2022    8:58 AM 08/31/2021   10:39 AM  PHQ 2/9 Scores  PHQ - 2 Score 0 0  PHQ- 9 Score 0 0   Most recent cognitive screening:    08/31/2021   10:13 AM  6CIT Screen  What Year? 0 points  What month? 0 points  What time? 0 points  Count back from 20 0 points  Months in reverse 0 points  Repeat phrase 0 points  Total Score 0 points   Most recent Audit-C alcohol use screening    09/01/2022    9:03 AM  Alcohol Use Disorder Test (AUDIT)  1. How often do you have a drink containing alcohol? 0  2. How many drinks containing alcohol do you have on a typical day when you are drinking? 0  3. How often do you have six or more drinks on one occasion? 0  AUDIT-C Score 0   A score  of 3 or more in women, and 4 or more in men indicates increased risk for alcohol abuse, EXCEPT if all of the points are from question 1   No results found for any visits on 09/01/22.  Assessment & Plan     Annual wellness visit done today including the all of the following: Reviewed patient's Family Medical History Reviewed and updated list of patient's medical providers Assessment of cognitive impairment was done Assessed patient's functional ability Established a written schedule for health screening [place] Completed and Reviewed  Exercise Activities and Dietary recommendations  Goals   None     Immunization History  Administered Date(s) Administered   Fluad Quad(high Dose 65+) 08/19/2020, 08/31/2021, 09/01/2022   Influenza,inj,Quad PF,6+ Mos 07/31/2015, 08/02/2016, 08/03/2017, 08/09/2018, 08/14/2019  Janssen (J&J) SARS-COV-2 Vaccination 01/09/2020, 07/30/2020   PNEUMOCOCCAL CONJUGATE-20 08/31/2021   Pneumococcal Polysaccharide-23 08/19/2020   Td 08/09/2018   Tdap 10/24/2007    Health Maintenance  Topic Date Due   Zoster Vaccines- Shingrix (1 of 2) Never done   COVID-19 Vaccine (3 - 2023-24 season) 06/04/2022   DEXA SCAN  09/25/2022   Medicare Annual Wellness (AWV)  09/02/2023   MAMMOGRAM  09/30/2023   DTaP/Tdap/Td (3 - Td or Tdap) 08/09/2028   COLONOSCOPY (Pts 45-27yrs Insurance coverage will need to be confirmed)  01/14/2032   Pneumonia Vaccine 79+ Years old  Completed   INFLUENZA VACCINE  Completed   Hepatitis C Screening  Completed   HPV VACCINES  Aged Out     Discussed health benefits of physical activity, and encouraged her to engage in regular exercise appropriate for her age and condition.     2. Need for immunization against influenza  - Flu Vaccine QUAD High Dose(Fluad)  3. Estrogen deficiency  - DG Bone Density; Future     The entirety of the information documented in the History of Present Illness, Review of Systems  and Physical Exam were personally obtained by me. Portions of this information were initially documented by the CMA and reviewed by me for thoroughness and accuracy.     Lelon Huh, MD  [facility] [phone] (phone) [phone] (fax)  [place]

## 2022-09-02 LAB — LIPID PANEL
Chol/HDL Ratio: 4.3 ratio (ref 0.0–4.4)
Cholesterol, Total: 215 mg/dL — ABNORMAL HIGH (ref 100–199)
HDL: 50 mg/dL (ref >39)
LDL Chol Calc (NIH): 150 mg/dL — ABNORMAL HIGH (ref 0–99)
Triglycerides: 86 mg/dL (ref 0–149)
VLDL Cholesterol Cal: 15 mg/dL (ref 5–40)

## 2022-09-02 LAB — COMPREHENSIVE METABOLIC PANEL
ALT: 16 IU/L (ref 0–32)
AST: 22 IU/L (ref 0–40)
Albumin/Globulin Ratio: 2.2 (ref 1.2–2.2)
Albumin: 4.8 g/dL (ref 3.9–4.9)
Alkaline Phosphatase: 60 IU/L (ref 44–121)
BUN/Creatinine Ratio: 20 (ref 12–28)
BUN: 14 mg/dL (ref 8–27)
Bilirubin Total: 0.5 mg/dL (ref 0.0–1.2)
CO2: 23 mmol/L (ref 20–29)
Calcium: 9.2 mg/dL (ref 8.7–10.3)
Chloride: 101 mmol/L (ref 96–106)
Creatinine, Ser: 0.71 mg/dL (ref 0.57–1.00)
Globulin, Total: 2.2 g/dL (ref 1.5–4.5)
Glucose: 87 mg/dL (ref 70–99)
Potassium: 4.1 mmol/L (ref 3.5–5.2)
Sodium: 139 mmol/L (ref 134–144)
Total Protein: 7 g/dL (ref 6.0–8.5)
eGFR: 93 mL/min/{1.73_m2} (ref >59)

## 2022-09-02 LAB — CBC
Hematocrit: 38.6 % (ref 34.0–46.6)
Hemoglobin: 13.1 g/dL (ref 11.1–15.9)
MCH: 29.8 pg (ref 26.6–33.0)
MCHC: 33.9 g/dL (ref 31.5–35.7)
MCV: 88 fL (ref 79–97)
Platelets: 252 10*3/uL (ref 150–450)
RBC: 4.39 x10E6/uL (ref 3.77–5.28)
RDW: 12.3 % (ref 11.7–15.4)
WBC: 7 10*3/uL (ref 3.4–10.8)

## 2022-09-29 ENCOUNTER — Telehealth: Payer: Self-pay

## 2022-09-29 NOTE — Telephone Encounter (Signed)
Copied from CRM [id number]. 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

## 2022-09-29 NOTE — Telephone Encounter (Signed)
Talked to pt regarding insurance issue with physical. [employer] was filed as that was her insurance at the time of CPE.  As far as labs, when she receives the bill from [facility], she will bring it by the office for me to copy and have Dr. Sherrie Mustache recode icd 10 codes if there are codes appropriate and have [facility] refile to [facility].

## 2022-09-30 ENCOUNTER — Ambulatory Visit
Admission: RE | Admit: 2022-09-30 | Discharge: 2022-09-30 | Disposition: A | Discharge status: Home / Self Care | Payer: Medicare HMO | Source: Ambulatory Visit | Attending: Family Medicine | Admitting: Family Medicine

## 2022-09-30 DIAGNOSIS — Z1231 Encounter for screening mammogram for malignant neoplasm of breast: Secondary | ICD-10-CM | POA: Diagnosis not present

## 2022-11-18 ENCOUNTER — Ambulatory Visit
Admission: RE | Admit: 2022-11-18 | Discharge: 2022-11-18 | Disposition: A | Discharge status: Home / Self Care | Payer: Medicare HMO | Source: Ambulatory Visit | Attending: Family Medicine | Admitting: Family Medicine

## 2022-11-18 DIAGNOSIS — M810 Age-related osteoporosis without current pathological fracture: Secondary | ICD-10-CM | POA: Diagnosis not present

## 2022-11-18 DIAGNOSIS — E2839 Other primary ovarian failure: Secondary | ICD-10-CM | POA: Insufficient documentation

## 2022-11-19 ENCOUNTER — Encounter: Payer: Self-pay | Admitting: Family Medicine

## 2022-11-19 DIAGNOSIS — M810 Age-related osteoporosis without current pathological fracture: Secondary | ICD-10-CM | POA: Insufficient documentation

## 2023-06-27 DIAGNOSIS — H18891 Other specified disorders of cornea, right eye: Secondary | ICD-10-CM | POA: Diagnosis not present

## 2023-06-27 DIAGNOSIS — H2513 Age-related nuclear cataract, bilateral: Secondary | ICD-10-CM | POA: Diagnosis not present

## 2023-09-07 ENCOUNTER — Encounter: Payer: Self-pay | Admitting: Family Medicine

## 2023-09-07 ENCOUNTER — Ambulatory Visit (INDEPENDENT_AMBULATORY_CARE_PROVIDER_SITE_OTHER): Payer: Medicare HMO | Admitting: Family Medicine

## 2023-09-07 VITALS — BP 126/82 | HR 60 | Ht 63.0 in | Wt 110.2 lb

## 2023-09-07 DIAGNOSIS — Z23 Encounter for immunization: Secondary | ICD-10-CM

## 2023-09-07 DIAGNOSIS — M810 Age-related osteoporosis without current pathological fracture: Secondary | ICD-10-CM | POA: Diagnosis not present

## 2023-09-07 DIAGNOSIS — E785 Hyperlipidemia, unspecified: Secondary | ICD-10-CM

## 2023-09-07 DIAGNOSIS — Z803 Family history of malignant neoplasm of breast: Secondary | ICD-10-CM

## 2023-09-07 DIAGNOSIS — Z0000 Encounter for general adult medical examination without abnormal findings: Secondary | ICD-10-CM

## 2023-09-08 LAB — COMPREHENSIVE METABOLIC PANEL
ALT: 17 [IU]/L (ref 0–32)
AST: 19 [IU]/L (ref 0–40)
Albumin: 4.7 g/dL (ref 3.9–4.9)
Alkaline Phosphatase: 71 [IU]/L (ref 44–121)
BUN/Creatinine Ratio: 22 (ref 12–28)
BUN: 17 mg/dL (ref 8–27)
Bilirubin Total: 0.6 mg/dL (ref 0.0–1.2)
CO2: 23 mmol/L (ref 20–29)
Calcium: 9.7 mg/dL (ref 8.7–10.3)
Chloride: 103 mmol/L (ref 96–106)
Creatinine, Ser: 0.76 mg/dL (ref 0.57–1.00)
Globulin, Total: 2.7 g/dL (ref 1.5–4.5)
Glucose: 85 mg/dL (ref 70–99)
Potassium: 4.4 mmol/L (ref 3.5–5.2)
Sodium: 142 mmol/L (ref 134–144)
Total Protein: 7.4 g/dL (ref 6.0–8.5)
eGFR: 85 mL/min/{1.73_m2} (ref >59)

## 2023-09-08 LAB — CBC
Hematocrit: 42 % (ref 34.0–46.6)
Hemoglobin: 13.8 g/dL (ref 11.1–15.9)
MCH: 29.8 pg (ref 26.6–33.0)
MCHC: 32.9 g/dL (ref 31.5–35.7)
MCV: 91 fL (ref 79–97)
Platelets: 256 10*3/uL (ref 150–450)
RBC: 4.63 x10E6/uL (ref 3.77–5.28)
RDW: 12.2 % (ref 11.7–15.4)
WBC: 7 10*3/uL (ref 3.4–10.8)

## 2023-09-08 LAB — LIPID PANEL
Chol/HDL Ratio: 3.7 {ratio} (ref 0.0–4.4)
Cholesterol, Total: 209 mg/dL — ABNORMAL HIGH (ref 100–199)
HDL: 57 mg/dL (ref >39)
LDL Chol Calc (NIH): 137 mg/dL — ABNORMAL HIGH (ref 0–99)
Triglycerides: 83 mg/dL (ref 0–149)
VLDL Cholesterol Cal: 15 mg/dL (ref 5–40)

## 2023-09-08 LAB — VITAMIN D 25 HYDROXY (VIT D DEFICIENCY, FRACTURES): Vit D, 25-Hydroxy: 47.2 ng/mL (ref 30.0–100.0)

## 2023-09-08 LAB — TSH: TSH: 2.88 u[IU]/mL (ref 0.450–4.500)

## 2023-09-09 ENCOUNTER — Other Ambulatory Visit: Payer: Self-pay | Admitting: Family Medicine

## 2023-09-09 DIAGNOSIS — Z1231 Encounter for screening mammogram for malignant neoplasm of breast: Secondary | ICD-10-CM

## 2023-09-25 NOTE — Progress Notes (Signed)
Established patient visit   Patient: Adrienne Herring   DOB: 1955/10/13   67 y.o. Female  MRN: [id number] Visit Date: 09/07/2023  Today's healthcare provider: [name], MD   Chief Complaint  Patient presents with   Annual Exam    Last AWV 09/01/22. Patient reports consuming a general diet and walking daily at work. She reports feeling well and sleeping well with no concerns to report.    Subjective    Discussed the use of AI scribe software for clinical note transcription with the patient, who gave verbal consent to proceed.  History of Present Illness   The patient, Adrienne Herring, presents for a routine physical examination. 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 She denies any specific health complaints or concerns at this time.  The patient has a history of borderline osteoporosis, as indicated by a bone density test conducted in February. She has been managing this condition with Vitamin D3 and calcium supplements, opting to avoid prescription medications for the time being. She expresses a desire to monitor the effectiveness of these supplements before considering other treatment options.  The patient is also due for a mammogram in December. She has a family history of breast cancer, with both her grandmother and paternal aunt having had the disease. She reports performing regular self-breast exams and has not noticed any abnormalities.  The patient also mentions that she had a colonoscopy last year. She is currently taking Vitamin D3 and calcium supplements for her borderline osteoporosis. She has not started any new medications recently.  The patient has not reported any chest pains, heart flutters, or trouble breathing. She has seen her eye doctor within the last year and has not noticed any unusual moles on her arms or legs. She has not reported any pain or tenderness during the physical  examination.       Medications: Outpatient Medications Prior to Visit  Medication Sig   Ascorbic Acid (VITAMIN C) 100 MG tablet    Calcium Carb-Cholecalciferol (CALCIUM 600 + D PO) Take 1 tablet by mouth 3 (three) times daily.   Cholecalciferol 25 MCG (1000 UT) tablet Take 2,000 Units by mouth daily.   No facility-administered medications prior to visit.   Review of Systems  Constitutional:  Negative for chills, diaphoresis and fever.  HENT:  Negative for congestion, ear discharge, ear pain, hearing loss, nosebleeds, sore throat and tinnitus.   Eyes:  Negative for photophobia, pain, discharge and redness.  Respiratory:  Negative for cough, shortness of breath, wheezing and stridor.   Cardiovascular:  Negative for chest pain, palpitations and leg swelling.  Gastrointestinal:  Negative for abdominal pain, blood in stool, constipation, diarrhea, nausea and vomiting.  Endocrine: Negative for polydipsia.  Genitourinary:  Negative for dysuria, flank pain, frequency, hematuria and urgency.  Musculoskeletal:  Negative for back pain, myalgias and neck pain.  Skin:  Negative for rash.  Allergic/Immunologic: Negative for environmental allergies.  Neurological:  Negative for dizziness, tremors, seizures, weakness and headaches.  Hematological:  Does not bruise/bleed easily.  Psychiatric/Behavioral:  Negative for hallucinations and suicidal ideas. The patient is not nervous/anxious.        Objective    BP 126/82 (BP Location: Left Arm, Patient Position: Sitting, Cuff Size: Normal)   Pulse 60   Ht 5\' 3"  (1.6 m)   Wt 110 lb 3.2 oz (50 kg)   SpO2 100%   BMI  19.52 kg/m   Physical Exam   General Appearance:    Thin female. Alert, cooperative, in no acute distress, appears stated age   Head:    Normocephalic, without obvious abnormality, atraumatic  Eyes:    PERRL, conjunctiva/corneas clear, EOM's intact, fundi    benign, both eyes  Ears:    Normal TM's and external ear canals, both ears   Nose:   Nares normal, septum midline, mucosa normal, no drainage    or sinus tenderness  Throat:   Lips, mucosa, and tongue normal; teeth and gums normal  Neck:   Supple, symmetrical, trachea midline, no adenopathy;    thyroid:  no enlargement/tenderness/nodules; no carotid   bruit or JVD  Back:     Symmetric, no curvature, ROM normal, no CVA tenderness  Lungs:     Clear to auscultation bilaterally, respirations unlabored  Chest Wall:    No tenderness or deformity   Heart:    Normal heart rate. Normal rhythm. No murmurs, rubs, or gallops.   Breast Exam:    normal appearance, no masses or tenderness  Abdomen:     Soft, non-tender, bowel sounds active all four quadrants,    no masses, no organomegaly  Pelvic:    deferred  Extremities:   All extremities are intact. No cyanosis or edema  Pulses:   2+ and symmetric all extremities  Skin:   Skin color, texture, turgor normal, no rashes or lesions  Lymph nodes:   Cervical, supraclavicular, and axillary nodes normal  Neurologic:   CNII-XII intact, normal strength, sensation and reflexes    throughout         Assessment & Plan        Osteopenia Borderline osteoporosis on bone density test. Currently taking Vitamin D3 2000 units and Calcium. Prefers to avoid prescription medications for now. -Continue Vitamin D3 and Calcium. -Check Vitamin D levels today. -Plan to repeat bone density test in a year.  Breast Cancer Screening Family history of breast cancer. Patient performs self-breast exams and has not noticed any lumps. -Perform breast exam today. -Remind patient to schedule mammogram in December.  General Health Maintenance -Order routine blood work. -Encourage patient to be careful at work to avoid falls due to osteopenia.    Return in about 1 year (around 09/06/2024) for Yearly Physical.      [name], MD  [facility] [phone] (phone) [phone] (fax)  [facility]

## 2023-10-04 ENCOUNTER — Ambulatory Visit
Admission: RE | Admit: 2023-10-04 | Discharge: 2023-10-04 | Disposition: A | Discharge status: Home / Self Care | Payer: Medicare HMO | Source: Ambulatory Visit | Attending: Family Medicine | Admitting: Family Medicine

## 2023-10-04 DIAGNOSIS — Z1231 Encounter for screening mammogram for malignant neoplasm of breast: Secondary | ICD-10-CM | POA: Diagnosis not present

## 2024-02-23 DIAGNOSIS — K080 Exfoliation of teeth due to systemic causes: Secondary | ICD-10-CM | POA: Diagnosis not present

## 2024-06-27 DIAGNOSIS — H18891 Other specified disorders of cornea, right eye: Secondary | ICD-10-CM | POA: Diagnosis not present

## 2024-06-27 DIAGNOSIS — H2513 Age-related nuclear cataract, bilateral: Secondary | ICD-10-CM | POA: Diagnosis not present

## 2024-08-27 ENCOUNTER — Telehealth: Payer: Self-pay

## 2024-08-27 DIAGNOSIS — E785 Hyperlipidemia, unspecified: Secondary | ICD-10-CM

## 2024-08-27 NOTE — Telephone Encounter (Unsigned)
 Copied from CRM [id number]. Topic: Clinical - Request for Lab/Test Order >> Aug 27, 2024  9:50 AM Adrienne Herring wrote: Reason for CRM: Pt is requesting a blood work order to be scheduled the day of physical December 5th. Please advise #[phone].

## 2024-08-28 NOTE — Telephone Encounter (Signed)
 Orders placed.

## 2024-08-29 NOTE — Telephone Encounter (Signed)
 VM left Ok to advise if call is returned

## 2024-09-07 ENCOUNTER — Ambulatory Visit: Payer: Self-pay | Admitting: Family Medicine

## 2024-09-07 ENCOUNTER — Encounter: Payer: Self-pay | Admitting: Family Medicine

## 2024-09-07 VITALS — BP 129/73 | HR 70 | Resp 16 | Ht 63.0 in | Wt 117.2 lb

## 2024-09-07 DIAGNOSIS — Z23 Encounter for immunization: Secondary | ICD-10-CM

## 2024-09-07 DIAGNOSIS — M810 Age-related osteoporosis without current pathological fracture: Secondary | ICD-10-CM | POA: Diagnosis not present

## 2024-09-07 DIAGNOSIS — Z0001 Encounter for general adult medical examination with abnormal findings: Secondary | ICD-10-CM | POA: Diagnosis not present

## 2024-09-07 DIAGNOSIS — Z0000 Encounter for general adult medical examination without abnormal findings: Secondary | ICD-10-CM

## 2024-09-07 NOTE — Patient Instructions (Signed)
 Adrienne Herring  Please review the attached list of medications and notify my office if there are any errors.   . Please bring all of your medications to every appointment so we can make sure that our medication list is the same as yours.

## 2024-09-07 NOTE — Progress Notes (Signed)
 "     Subjective:   Adrienne Herring is a 67 y.o. female who presents for a Medicare Annual Wellness Visit.  Visit info / Clinical Intake: Medicare Wellness Visit Type:: Subsequent Annual Wellness Visit Persons participating in visit and providing information:: patient Medicare Wellness Visit Mode:: In-person (required for WTM) Interpreter Needed?: No Pre-visit prep was completed: no AWV questionnaire completed by patient prior to visit?: no Living arrangements:: lives with spouse/significant other Patient's Overall Health Status Rating: excellent Typical amount of pain: none Does pain affect daily life?: no Are you currently prescribed opioids?: no  Dietary Habits and Nutritional Risks How many meals a day?: 3 Eats fruit and vegetables daily?: yes Most meals are obtained by: preparing own meals In the last 2 weeks, have you had any of the following?: none Diabetic:: no  Functional Status Activities of Daily Living (to include ambulation/medication): Independent Ambulation: Independent Medication Administration: Independent Home Management (perform basic housework or laundry): Independent Manage your own finances?: yes Primary transportation is: driving Concerns about vision?: no *vision screening is required for WTM* Concerns about hearing?: no  Fall Screening Falls in the past year?: 0 Number of falls in past year: 0 Was there an injury with Fall?: 0 Fall Risk Category Calculator: 0 Patient Fall Risk Level: Low Fall Risk  Fall Risk Patient at Risk for Falls Due to: No Fall Risks Fall risk Follow up: Falls evaluation completed  Home and Transportation Safety: All rugs have non-skid backing?: N/A, no rugs All stairs or steps have railings?: yes Grab bars in the bathtub or shower?: yes Have non-skid surface in bathtub or shower?: yes Good home lighting?: yes Regular seat belt use?: yes Hospital stays in the last year:: no  Cognitive Assessment Difficulty  concentrating, remembering, or making decisions? : no Will 6CIT or Mini Cog be Completed: no 6CIT or Mini Cog Declined: patient alert, oriented, able to answer questions appropriately and recall recent events  Advance Directives (For Healthcare) Does Patient Have a Medical Advance Directive?: Yes Type of Advance Directive: Healthcare Power of [place]; Living will Copy of Healthcare Power of Attorney in Chart?: No - copy requested Copy of Living Will in Chart?: No - copy requested  Reviewed/Updated  Reviewed/Updated: Reviewed All (Medical, Surgical, Family, Medications, Allergies, Care Teams, Patient Goals)    Allergies (verified) Patient has no known allergies.   Current Medications (verified) Outpatient Encounter Medications as of 09/07/2024  Medication Sig   Ascorbic Acid (VITAMIN C) 100 MG tablet    aspirin EC 81 MG tablet Take 81 mg by mouth daily. Swallow whole.   Calcium Carb-Cholecalciferol (CALCIUM 600 + D PO) Take 1 tablet by mouth 3 (three) times daily.   Cholecalciferol 25 MCG (1000 UT) tablet Take 5,000 Units by mouth daily. D3   No facility-administered encounter medications on file as of 09/07/2024.    History: History reviewed. No pertinent past medical history. Past Surgical History:  Procedure Laterality Date   ABDOMINAL HYSTERECTOMY  1980   due to endometriosis, still has ovaries   Family History  Problem Relation Age of Onset   Alcohol abuse Mother    Breast cancer Sister 46   Ovarian cancer Maternal Aunt    Colon cancer Maternal Grandmother    Diabetes Maternal Grandfather    Social History   Occupational History   Not on file  Tobacco Use   Smoking status: Never   Smokeless tobacco: Never  Vaping Use   Vaping status: Never Used  Substance and Sexual Activity  Alcohol use: No   Drug use: No   Sexual activity: Not on file   Tobacco Counseling Counseling given: Not Answered  SDOH Screenings   Food Insecurity: No Food Insecurity  (09/07/2024)  Housing: Low Risk  (09/07/2024)  Transportation Needs: No Transportation Needs (09/07/2024)  Utilities: Not At Risk (09/07/2024)  Alcohol Screen: Low Risk  (09/01/2022)  Depression (PHQ2-9): Low Risk  (09/07/2024)  Physical Activity: Sufficiently Active (09/07/2024)  Social Connections: Socially Integrated (09/07/2024)  Stress: No Stress Concern Present (09/07/2024)  Tobacco Use: Low Risk  (09/07/2024)  Health Literacy: Adequate Health Literacy (09/07/2024)   See flowsheets for full screening details  Depression Screen PHQ 2 & 9 Depression Scale- Over the past 2 weeks, how often have you been bothered by any of the following problems? Little interest or pleasure in doing things: 0 Feeling down, depressed, or hopeless (PHQ Adolescent also includes...irritable): 0 PHQ-2 Total Score: 0     Goals Addressed             This Visit's Progress    DIET - EAT MORE FRUITS AND VEGETABLES       Exercise 150 min/wk Moderate Activity               Objective:    Today's Vitals   09/07/24 0958  BP: 129/73  Pulse: 70  Resp: 16  SpO2: 99%  Weight: 117 lb 3.2 oz (53.2 kg)  Height: 5' 3 (1.6 m)   Body mass index is 20.76 kg/m.  Hearing/Vision screen Vision Screening - Comments:: Last eye exam 06/2024 Dr.Woodard Immunizations and Health Maintenance Health Maintenance  Topic Date Due   Zoster Vaccines- Shingrix (1 of 2) Never done   Influenza Vaccine  05/04/2024   COVID-19 Vaccine (3 - 2025-26 season) 06/04/2024   Bone Density Scan  11/18/2024   Medicare Annual Wellness (AWV)  09/07/2025   Mammogram  10/03/2025   DTaP/Tdap/Td (3 - Td or Tdap) 08/09/2028   Colonoscopy  01/14/2032   Pneumococcal Vaccine: 50+ Years  Completed   Hepatitis C Screening  Completed   Meningococcal B Vaccine  Aged Out        Assessment/Plan:  This is a routine wellness examination for Adrienne Herring.  Patient Care Team: Gasper Nancyann BRAVO, MD as PCP - General (Family Medicine) Onita Elspeth Sharper, DO as Consulting Physician (Gastroenterology) [facility], [place]  I have personally reviewed and noted the following in the patients chart:   Medical and social history Use of alcohol, tobacco or illicit drugs  Current medications and supplements including opioid prescriptions. Functional ability and status Nutritional status Physical activity Advanced directives List of other physicians Hospitalizations, surgeries, and ER visits in previous 12 months Vitals Screenings to include cognitive, depression, and falls Referrals and appointments  Orders Placed This Encounter  Procedures   Flu vaccine HIGH DOSE PF(Fluzone Trivalent)   In addition, I have reviewed and discussed with patient certain preventive protocols, quality metrics, and best practice recommendations. A written personalized care plan for preventive services as well as general preventive health recommendations were provided to patient.   Nancyann Gasper, MD   09/07/2024     After Visit Summary: (In Person-Printed) AVS printed and given to the patient  "

## 2024-09-08 ENCOUNTER — Ambulatory Visit: Payer: Self-pay | Admitting: Family Medicine

## 2024-09-08 LAB — LIPID PANEL
Chol/HDL Ratio: 3.4 ratio (ref 0.0–4.4)
Cholesterol, Total: 180 mg/dL (ref 100–199)
HDL: 53 mg/dL (ref >39)
LDL Chol Calc (NIH): 114 mg/dL — ABNORMAL HIGH (ref 0–99)
Triglycerides: 68 mg/dL (ref 0–149)
VLDL Cholesterol Cal: 13 mg/dL (ref 5–40)

## 2024-09-08 LAB — CBC
Hematocrit: 41.8 % (ref 34.0–46.6)
Hemoglobin: 13.5 g/dL (ref 11.1–15.9)
MCH: 30.4 pg (ref 26.6–33.0)
MCHC: 32.3 g/dL (ref 31.5–35.7)
MCV: 94 fL (ref 79–97)
Platelets: 269 x10E3/uL (ref 150–450)
RBC: 4.44 x10E6/uL (ref 3.77–5.28)
RDW: 12.6 % (ref 11.7–15.4)
WBC: 5.9 x10E3/uL (ref 3.4–10.8)

## 2024-09-08 LAB — COMPREHENSIVE METABOLIC PANEL WITH GFR
ALT: 11 IU/L (ref 0–32)
AST: 18 IU/L (ref 0–40)
Albumin: 4.4 g/dL (ref 3.9–4.9)
Alkaline Phosphatase: 61 IU/L (ref 49–135)
BUN/Creatinine Ratio: 23 (ref 12–28)
BUN: 16 mg/dL (ref 8–27)
Bilirubin Total: 0.6 mg/dL (ref 0.0–1.2)
CO2: 24 mmol/L (ref 20–29)
Calcium: 9.2 mg/dL (ref 8.7–10.3)
Chloride: 104 mmol/L (ref 96–106)
Creatinine, Ser: 0.7 mg/dL (ref 0.57–1.00)
Globulin, Total: 2.4 g/dL (ref 1.5–4.5)
Glucose: 83 mg/dL (ref 70–99)
Potassium: 4.4 mmol/L (ref 3.5–5.2)
Sodium: 140 mmol/L (ref 134–144)
Total Protein: 6.8 g/dL (ref 6.0–8.5)
eGFR: 94 mL/min/1.73 (ref >59)

## 2024-09-19 ENCOUNTER — Other Ambulatory Visit: Payer: Self-pay | Admitting: Family Medicine

## 2024-09-19 DIAGNOSIS — Z1231 Encounter for screening mammogram for malignant neoplasm of breast: Secondary | ICD-10-CM

## 2024-09-21 NOTE — Progress Notes (Signed)
 "    Complete physical exam   Patient: Adrienne Herring   DOB: 07-15-1956   68 y.o. Female  MRN: [id number] Visit Date: 09/07/2024  Today's healthcare provider: Nancyann Perry, MD   Chief Complaint  Patient presents with   Medicare Wellness   Annual Exam   Subjective    Discussed the use of AI scribe software for clinical note transcription with the patient, who gave verbal consent to proceed.  History of Present Illness   Adrienne Herring is a 68 year old female who presents for a routine yearly physical exam.  She has been maintaining her health without significant issues, focusing on a healthy diet and staying active. She recently returned from a 14-day trip to [place], which involved extensive walking. Although tiring, she managed well and found the trip educational.  She is currently taking Vitamin D3 at a dose of 5000 IU daily along with calcium supplements to manage her osteoporosis. No trouble with hearing, tinnitus, respiratory difficulties, gastrointestinal issues, or changes in bowel habits.  Her gallbladder is intact, but her appendix was removed during her second pregnancy.  Her father is in an assisted living facility and is doing alright. Her father passed away at the age of 67.  She is on private insurance and has Medicare. She has two daughters, and two sons who have passed away. She has been married for 50 years.     Lab Results  Component Value Date   CHOL 180 09/07/2024   HDL 53 09/07/2024   LDLCALC 114 (H) 09/07/2024   TRIG 68 09/07/2024   CHOLHDL 3.4 09/07/2024   Lab Results  Component Value Date   NA 140 09/07/2024   K 4.4 09/07/2024   CREATININE 0.70 09/07/2024   EGFR 94 09/07/2024   GLUCOSE 83 09/07/2024       History reviewed. No pertinent past medical history. Past Surgical History:  Procedure Laterality Date   ABDOMINAL HYSTERECTOMY  1980   due to endometriosis, still has ovaries   Social History   Socioeconomic History   Marital status:  Married    Spouse name: Not on file   Number of children: 2   Years of education: Not on file   Highest education level: Not on file  Occupational History   Not on file  Tobacco Use   Smoking status: Never   Smokeless tobacco: Never  Vaping Use   Vaping status: Never Used  Substance and Sexual Activity   Alcohol use: No   Drug use: No   Sexual activity: Not on file  Other Topics Concern   Not on file  Social History Narrative   Not on file   Social Drivers of Health   Tobacco Use: Low Risk (09/07/2024)   Patient History    Smoking Tobacco Use: Never    Smokeless Tobacco Use: Never    Passive Exposure: Not on file  Financial Resource Strain: Not on file  Food Insecurity: No Food Insecurity (09/07/2024)   Epic    Worried About Programme Researcher, Broadcasting/film/video in the Last Year: Never true    Ran Out of Food in the Last Year: Never true  Transportation Needs: No Transportation Needs (09/07/2024)   Epic    Lack of Transportation (Medical): No    Lack of Transportation (Non-Medical): No  Physical Activity: Sufficiently Active (09/07/2024)   Exercise Vital Sign    Days of Exercise per Week: 7 days    Minutes of Exercise per Session: 150+ min  Stress: No Stress Concern Present (09/07/2024)   [employer] of Occupational Health - Occupational Stress Questionnaire    Feeling of Stress: Not at all  Social Connections: Socially Integrated (09/07/2024)   Social Connection and Isolation Panel    Frequency of Communication with Friends and Family: More than three times a week    Frequency of Social Gatherings with Friends and Family: More than three times a week    Attends Religious Services: More than 4 times per year    Active Member of Clubs or Organizations: Yes    Attends Banker Meetings: More than 4 times per year    Marital Status: Married  Catering Manager Violence: Not At Risk (09/07/2024)   Epic    Fear of Current or Ex-Partner: No    Emotionally Abused: No     Physically Abused: No    Sexually Abused: No  Depression (PHQ2-9): Low Risk (09/07/2024)   Depression (PHQ2-9)    PHQ-2 Score: 0  Alcohol Screen: Low Risk (09/01/2022)   Alcohol Screen    Last Alcohol Screening Score (AUDIT): 0  Housing: Low Risk (09/07/2024)   Epic    Unable to Pay for Housing in the Last Year: No    Number of Times Moved in the Last Year: 0    Homeless in the Last Year: No  Utilities: Not At Risk (09/07/2024)   Epic    Threatened with loss of utilities: No  Health Literacy: Adequate Health Literacy (09/07/2024)   B1300 Health Literacy    Frequency of need for help with medical instructions: Never   Family Status  Relation Name Status   Mother  Deceased   Father  Deceased   Sister  Deceased   Daughter  Alive   Daughter  Alive   Mat Aunt  Alive   MGM  Deceased   MGF  Deceased  No partnership data on file   Family History  Problem Relation Age of Onset   Alcohol abuse Mother    Breast cancer Sister 34   Ovarian cancer Maternal Aunt    Colon cancer Maternal Grandmother    Diabetes Maternal Grandfather    Allergies[1]  Patient Care Team: Gasper Nancyann BRAVO, MD as PCP - General (Family Medicine) Onita Elspeth Sharper, DO as Consulting Physician (Gastroenterology) [facility], [place]   Medications: Show/hide medication list[2]  Review of Systems    Objective    BP 129/73 (BP Location: Left Arm, Patient Position: Sitting, Cuff Size: Normal)   Pulse 70   Resp 16   Ht 5' 3 (1.6 m)   Wt 117 lb 3.2 oz (53.2 kg)   SpO2 99%   BMI 20.76 kg/m    Physical Exam  General Appearance:    Well developed, well nourished female. Alert, cooperative, in no acute distress, appears stated age   Head:    Normocephalic, without obvious abnormality, atraumatic  Eyes:    PERRL, conjunctiva/corneas clear, EOM's intact, fundi    benign, both eyes  Ears:    Normal TM's and external ear canals, both ears  Nose:   Nares normal, septum midline, mucosa  normal, no drainage    or sinus tenderness  Throat:   Lips, mucosa, and tongue normal; teeth and gums normal  Neck:   Supple, symmetrical, trachea midline, no adenopathy;    thyroid :  no enlargement/tenderness/nodules; no carotid   bruit or JVD  Back:     Symmetric, no curvature, ROM normal, no CVA tenderness  Lungs:  Clear to auscultation bilaterally, respirations unlabored  Chest Wall:    No tenderness or deformity   Heart:    Normal heart rate. Normal rhythm. No murmurs, rubs, or gallops.   Breast Exam:    deferred  Abdomen:     Soft, non-tender, bowel sounds active all four quadrants,    no masses, no organomegaly  Pelvic:    deferred  Extremities:   All extremities are intact. No cyanosis or edema  Pulses:   2+ and symmetric all extremities  Skin:   Skin color, texture, turgor normal, no rashes or lesions  Lymph nodes:   Cervical, supraclavicular, and axillary nodes normal  Neurologic:   CNII-XII intact, normal strength, sensation and reflexes    throughout        Assessment & Plan    Routine Health Maintenance and Physical Exam  Exercise Activities and Dietary recommendations  Goals      DIET - EAT MORE FRUITS AND VEGETABLES     Exercise 150 min/wk Moderate Activity        Immunization History  Administered Date(s) Administered   Fluad Quad(high Dose 65+) 08/19/2020, 08/31/2021, 09/01/2022   Fluad Trivalent(High Dose 65+) 09/07/2023   INFLUENZA, HIGH DOSE SEASONAL PF 09/07/2024   Influenza,inj,Quad PF,6+ Mos 07/31/2015, 08/02/2016, 08/03/2017, 08/09/2018, 08/14/2019   Janssen (J&J) SARS-COV-2 Vaccination 01/09/2020, 07/30/2020   PNEUMOCOCCAL CONJUGATE-20 08/31/2021   Pneumococcal Polysaccharide-23 08/19/2020   Td 08/09/2018   Tdap 10/24/2007    Health Maintenance  Topic Date Due   Zoster Vaccines- Shingrix (1 of 2) Never done   COVID-19 Vaccine (3 - 2025-26 season) 06/04/2024   Bone Density Scan  11/18/2024   Medicare Annual Wellness (AWV)  09/07/2025    Mammogram  10/03/2025   DTaP/Tdap/Td (3 - Td or Tdap) 08/09/2028   Colonoscopy  01/14/2032   Pneumococcal Vaccine: 50+ Years  Completed   Influenza Vaccine  Completed   Hepatitis C Screening  Completed   Meningococcal B Vaccine  Aged Out    Discussed health benefits of physical activity, and encouraged her to engage in regular exercise appropriate for her age and condition.      Routine annual exam completed. No acute issues. Blood work pending. - Administered flu shot. - Review blood work results when available.  Age-related osteoporosis without current pathological fracture Osteoporosis managed with vitamin D3 and calcium. No fractures. Bone density test due in February. - Continue vitamin D3 5000 IU daily. - Continue calcium supplementation. - Schedule bone density test in February.     Return in about 1 year (around 09/07/2025) for Yearly Physical.        Nancyann Perry, MD  [facility] [phone] (phone) [phone] (fax)  [facility]    [1] No Known Allergies [2]  Outpatient Medications Prior to Visit  Medication Sig   Ascorbic Acid (VITAMIN C) 100 MG tablet    aspirin EC 81 MG tablet Take 81 mg by mouth daily. Swallow whole.   Calcium Carb-Cholecalciferol (CALCIUM 600 + D PO) Take 1 tablet by mouth 3 (three) times daily.   Cholecalciferol 25 MCG (1000 UT) tablet Take 5,000 Units by mouth daily. D3   No facility-administered medications prior to visit.   "

## 2024-09-24 DIAGNOSIS — K080 Exfoliation of teeth due to systemic causes: Secondary | ICD-10-CM | POA: Diagnosis not present

## 2024-10-23 ENCOUNTER — Ambulatory Visit
Admission: RE | Admit: 2024-10-23 | Discharge: 2024-10-23 | Disposition: A | Discharge status: Home / Self Care | Source: Ambulatory Visit | Attending: Family Medicine | Admitting: Family Medicine

## 2024-10-23 DIAGNOSIS — Z1231 Encounter for screening mammogram for malignant neoplasm of breast: Secondary | ICD-10-CM | POA: Insufficient documentation

## 2025-09-09 ENCOUNTER — Encounter: Admitting: Family Medicine
